# Patient Record
Sex: Male | Born: 1985 | Race: Black or African American | Hispanic: No | Marital: Single | Smoking: Current every day smoker
Health system: Southern US, Community
[De-identification: ages and names within clinical notes are randomized; demographics above are authoritative.]

## PROBLEM LIST (undated history)

## (undated) DIAGNOSIS — Z9109 Other allergy status, other than to drugs and biological substances: Secondary | ICD-10-CM

## (undated) DIAGNOSIS — F32A Depression, unspecified: Secondary | ICD-10-CM

## (undated) DIAGNOSIS — F909 Attention-deficit hyperactivity disorder, unspecified type: Secondary | ICD-10-CM

## (undated) DIAGNOSIS — T7840XA Allergy, unspecified, initial encounter: Secondary | ICD-10-CM

## (undated) DIAGNOSIS — F329 Major depressive disorder, single episode, unspecified: Secondary | ICD-10-CM

## (undated) HISTORY — DX: Depression, unspecified: F32.A

## (undated) HISTORY — DX: Major depressive disorder, single episode, unspecified: F32.9

## (undated) HISTORY — DX: Allergy, unspecified, initial encounter: T78.40XA

## (undated) HISTORY — DX: Attention-deficit hyperactivity disorder, unspecified type: F90.9

---

## 2011-09-24 ENCOUNTER — Emergency Department (HOSPITAL_COMMUNITY): Payer: PRIVATE HEALTH INSURANCE

## 2011-09-24 ENCOUNTER — Emergency Department (HOSPITAL_COMMUNITY)
Admission: EM | Admit: 2011-09-24 | Discharge: 2011-09-24 | Disposition: A | Discharge status: Home / Self Care | Payer: PRIVATE HEALTH INSURANCE | Source: Home / Self Care | Attending: Emergency Medicine | Admitting: Emergency Medicine

## 2011-09-24 ENCOUNTER — Encounter (HOSPITAL_COMMUNITY): Payer: Self-pay | Admitting: *Deleted

## 2011-09-24 ENCOUNTER — Emergency Department (INDEPENDENT_AMBULATORY_CARE_PROVIDER_SITE_OTHER): Payer: PRIVATE HEALTH INSURANCE

## 2011-09-24 DIAGNOSIS — H1130 Conjunctival hemorrhage, unspecified eye: Secondary | ICD-10-CM

## 2011-09-24 DIAGNOSIS — H05239 Hemorrhage of unspecified orbit: Secondary | ICD-10-CM

## 2011-09-24 HISTORY — DX: Other allergy status, other than to drugs and biological substances: Z91.09

## 2011-09-24 MED ORDER — POLYETHYL GLYCOL-PROPYL GLYCOL 0.4-0.3 % OP SOLN: OPHTHALMIC | Status: DC

## 2011-09-24 MED ORDER — IBUPROFEN 800 MG PO TABS: 800.0000 mg | ORAL_TABLET | Freq: Three times a day (TID) | ORAL | Status: AC
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
## 2013-09-27 ENCOUNTER — Ambulatory Visit (INDEPENDENT_AMBULATORY_CARE_PROVIDER_SITE_OTHER): Payer: 59 | Admitting: Emergency Medicine

## 2013-09-27 VITALS — BP 134/90 | HR 75 | Temp 98.0°F | Resp 16 | Ht 68.5 in | Wt 199.0 lb

## 2013-09-27 DIAGNOSIS — IMO0002 Reserved for concepts with insufficient information to code with codable children: Secondary | ICD-10-CM

## 2013-09-27 DIAGNOSIS — Z23 Encounter for immunization: Secondary | ICD-10-CM

## 2013-09-27 DIAGNOSIS — T148XXA Other injury of unspecified body region, initial encounter: Secondary | ICD-10-CM

## 2013-09-27 DIAGNOSIS — M79609 Pain in unspecified limb: Secondary | ICD-10-CM

## 2013-09-27 DIAGNOSIS — M79605 Pain in left leg: Secondary | ICD-10-CM
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
## 2013-09-27 NOTE — Patient Instructions (Signed)

## 2013-09-27 NOTE — Progress Notes (Signed)
Procedure:  Consent obtained.  Local anesthesia with 2% lido with epi.  4 cm wound cleaned and rough edges debrided.  Subcutaneous sutures # 3 placed and superficial closure with 4-0 Ethilon # 6 horizontal and #4 SI sutures placed.  Wound care d/w pt.  Drgs placed.

## 2013-10-11 ENCOUNTER — Ambulatory Visit (INDEPENDENT_AMBULATORY_CARE_PROVIDER_SITE_OTHER): Payer: 59 | Admitting: Family Medicine

## 2013-10-11 VITALS — BP 126/76 | HR 68 | Temp 98.1°F | Resp 16 | Ht 68.5 in | Wt 201.0 lb

## 2013-10-11 DIAGNOSIS — Z4802 Encounter for removal of sutures: Secondary | ICD-10-CM
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
## 2013-12-17 ENCOUNTER — Ambulatory Visit (INDEPENDENT_AMBULATORY_CARE_PROVIDER_SITE_OTHER): Payer: 59 | Admitting: Family Medicine

## 2013-12-17 VITALS — BP 122/80 | HR 98 | Temp 98.0°F | Resp 18 | Ht 68.5 in | Wt 208.0 lb

## 2013-12-17 DIAGNOSIS — K219 Gastro-esophageal reflux disease without esophagitis: Secondary | ICD-10-CM

## 2013-12-17 DIAGNOSIS — R0981 Nasal congestion: Secondary | ICD-10-CM

## 2013-12-17 DIAGNOSIS — J029 Acute pharyngitis, unspecified: Secondary | ICD-10-CM

## 2013-12-17 DIAGNOSIS — J020 Streptococcal pharyngitis: Secondary | ICD-10-CM

## 2013-12-17 DIAGNOSIS — J302 Other seasonal allergic rhinitis: Secondary | ICD-10-CM

## 2013-12-17 LAB — POCT RAPID STREP A (OFFICE): RAPID STREP A SCREEN: POSITIVE — AB

## 2013-12-17 MED ORDER — AMOXICILLIN 875 MG PO TABS: 875.0000 mg | ORAL_TABLET | Freq: Two times a day (BID) | ORAL | Status: AC

## 2013-12-17 MED ORDER — CETIRIZINE HCL 10 MG PO TABS: 10.0000 mg | ORAL_TABLET | Freq: Every day | ORAL | Status: DC

## 2013-12-17 MED ORDER — FLUTICASONE PROPIONATE 50 MCG/ACT NA SUSP: 2.0000 | Freq: Every day | NASAL | Status: DC

## 2013-12-17 MED ORDER — IPRATROPIUM BROMIDE 0.03 % NA SOLN: 2.0000 | Freq: Two times a day (BID) | NASAL | Status: DC

## 2013-12-17 NOTE — Patient Instructions (Signed)
Strep Throat Strep throat is an infection of the throat caused by a bacteria named Streptococcus pyogenes. Your health care provider may call the infection streptococcal "tonsillitis" or "pharyngitis" depending on whether there are signs of inflammation in the tonsils or back of the throat. Strep throat is most common in children aged 30-15 years during the cold months of the year, but it can occur in people of any age during any season. This infection is spread from person to person (contagious) through coughing, sneezing, or other close contact. SIGNS AND SYMPTOMS   Fever or chills.  Painful, swollen, red tonsils or throat.  Pain or difficulty when swallowing.  White or yellow spots on the tonsils or throat.  Swollen, tender lymph nodes or "glands" of the neck or under the jaw.  Red rash all over the body (rare). DIAGNOSIS  Many different infections can cause the same symptoms. A test must be done to confirm the diagnosis so the right treatment can be given. A "rapid strep test" can help your health care provider make the diagnosis in a few minutes. If this test is not available, a light swab of the infected area can be used for a throat culture test. If a throat culture test is done, results are usually available in a day or two. TREATMENT  Strep throat is treated with antibiotic medicine. HOME CARE INSTRUCTIONS   Gargle with 1 tsp of salt in 1 cup of warm water, 3-4 times per day or as needed for comfort.  Family members who also have a sore throat or fever should be tested for strep throat and treated with antibiotics if they have the strep infection.  Make sure everyone in your household washes their hands well.  Do not share food, drinking cups, or personal items that could cause the infection to spread to others.  You may need to eat a soft food diet until your sore throat gets better.  Drink enough water and fluids to keep your urine clear or pale yellow. This will help prevent  dehydration.  Get plenty of rest.  Stay home from school, day care, or work until you have been on antibiotics for 24 hours.  Take medicines only as directed by your health care provider.  Take your antibiotic medicine as directed by your health care provider. Finish it even if you start to feel better. SEEK MEDICAL CARE IF:   The glands in your neck continue to enlarge.  You develop a rash, cough, or earache.  You cough up green, yellow-brown, or bloody sputum.  You have pain or discomfort not controlled by medicines.  Your problems seem to be getting worse rather than better.  You have a fever. SEEK IMMEDIATE MEDICAL CARE IF:   You develop any new symptoms such as vomiting, severe headache, stiff or painful neck, chest pain, shortness of breath, or trouble swallowing.  You develop severe throat pain, drooling, or changes in your voice.  You develop swelling of the neck, or the skin on the neck becomes red and tender.  You develop signs of dehydration, such as fatigue, dry mouth, and decreased urination.  You become increasingly sleepy, or you cannot wake up completely. MAKE SURE YOU:  Understand these instructions.  Will watch your condition.  Will get help right away if you are not doing well or get worse. Document Released: 02/09/2000 Document Revised: 06/28/2013 Document Reviewed: 04/12/2010 [facility] Patient Information 2015 [place], [place]. This information is not intended to replace advice given to you by  your health care provider. Make sure you discuss any questions you have with your health care provider.    Allergic Rhinitis Allergic rhinitis is when the mucous membranes in the nose respond to allergens. Allergens are particles in the air that cause your body to have an allergic reaction. This causes you to release allergic antibodies. Through a chain of events, these eventually cause you to release histamine into the blood stream. Although meant to protect the  body, it is this release of histamine that causes your discomfort, such as frequent sneezing, congestion, and an itchy, runny nose.  CAUSES  Seasonal allergic rhinitis (hay fever) is caused by pollen allergens that may come from grasses, trees, and weeds. Year-round allergic rhinitis (perennial allergic rhinitis) is caused by allergens such as house dust mites, pet dander, and mold spores.  SYMPTOMS   Nasal stuffiness (congestion).  Itchy, runny nose with sneezing and tearing of the eyes. DIAGNOSIS  Your health care provider can help you determine the allergen or allergens that trigger your symptoms. If you and your health care provider are unable to determine the allergen, skin or blood testing may be used. TREATMENT  Allergic rhinitis does not have a cure, but it can be controlled by:  Medicines and allergy shots (immunotherapy).  Avoiding the allergen. Hay fever may often be treated with antihistamines in pill or nasal spray forms. Antihistamines block the effects of histamine. There are over-the-counter medicines that may help with nasal congestion and swelling around the eyes. Check with your health care provider before taking or giving this medicine.  If avoiding the allergen or the medicine prescribed do not work, there are many new medicines your health care provider can prescribe. Stronger medicine may be used if initial measures are ineffective. Desensitizing injections can be used if medicine and avoidance does not work. Desensitization is when a patient is given ongoing shots until the body becomes less sensitive to the allergen. Make sure you follow up with your health care provider if problems continue. HOME CARE INSTRUCTIONS It is not possible to completely avoid allergens, but you can reduce your symptoms by taking steps to limit your exposure to them. It helps to know exactly what you are allergic to so that you can avoid your specific triggers. SEEK MEDICAL CARE IF:   You have  a fever.  You develop a cough that does not stop easily (persistent).  You have shortness of breath.  You start wheezing.  Symptoms interfere with normal daily activities. Document Released: 11/06/2000 Document Revised: 02/16/2013 Document Reviewed: 10/19/2012 [facility] Patient Information 2015 [place]. This information is not intended to replace advice given to you by your health care provider. Make sure you discuss any questions you have with your health care provider.

## 2013-12-17 NOTE — Progress Notes (Signed)
Subjective:    Patient ID: Dan Brady, male    DOB: [birth year], 28 y.o.   MRN: [id number]  HPI  Dan Brady is presenting for 5 day history of abdominal pain and 1 day history of sore throat.  Abdominal pain - on Sunday, patient reports that started feeling ill, had fever, felt a stomach ache, nausea, vomiting (w/o blood) x5 through Wednesday, achy, fatigued, fevers continued through Tuesday. Patient drank fluids, chicken noodle soup, rested, tried thera-flu with moderate relief, still feels iffy with his stomach but no longer feels pain, n/v have resolved.   Sore throat - yesterday, started feeling throat soreness worse with swallowing, had a sinus headache, lymphadenopathy. He has only tried robitussin but denies cough, he is worried he has an infection. Denies sick contacts, fevers yesterday, congestion, ear pain, itchy watery eyes, tooth pain, sob, difficulty breathing. Smokes 1/2ppd and social drinking (1-2 beers) on the weekends.  GERD - patient reports ongoing reflux symptoms including epigastric pain and sour brash. Has been trying prilosec with some relief but would like recommendations. Eats acidic foods, pastas, hot sauce, symptoms worse when he lays down.   Denies any other questions, concerns, aggravating or relieving factors.   No current medications.  No Known Allergies  Past Medical History  Diagnosis Date  . Environmental allergies   . Allergy    History reviewed. No pertinent past surgical history.  History  Substance Use Topics  . Smoking status: Current Every Day Smoker  . Smokeless tobacco: Not on file  . Alcohol Use: 1.8 oz/week    2 Cans of beer, 1 Shots of liquor per week    Review of Systems As in subjective.    Objective:   Physical Exam  Vitals reviewed. Constitutional: He appears well-developed and well-nourished. No distress.  BP 122/80  Pulse 98  Temp(Src) 98 F (36.7 C) (Oral)  Resp 18  Ht 5' 8.5" (1.74 m)  Wt 208 lb (94.348 kg)   BMI 31.16 kg/m2  SpO2 98%   HENT:  Head: Normocephalic and atraumatic.  Right Ear: External ear normal.  Left Ear: External ear normal.  Mouth/Throat: Oropharynx is clear and moist. No oropharyngeal exudate.  Inflammed turbinates, thick mucous bilaterally.  Eyes: Conjunctivae and EOM are normal. Pupils are equal, round, and reactive to light. Right eye exhibits no discharge. Left eye exhibits no discharge. No scleral icterus.  Neck: Normal range of motion. Neck supple. No thyromegaly present.  Cardiovascular: Normal rate, regular rhythm, normal heart sounds and intact distal pulses.  Exam reveals no gallop and no friction rub.   No murmur heard. Pulmonary/Chest: Effort normal and breath sounds normal. No stridor. No respiratory distress. He has no wheezes. He has no rales.  Abdominal: Soft. Bowel sounds are normal. He exhibits no distension and no mass. There is no tenderness.  Lymphadenopathy:    He has cervical adenopathy (anterior).  Skin: Skin is warm and dry. No rash noted. He is not diaphoretic. No erythema.  Psychiatric: He has a normal mood and affect. His behavior is normal.   Results for orders placed in visit on 12/17/13 (from the past 24 hour(s))  POCT RAPID STREP A (OFFICE)     Status: Abnormal   Collection Time    12/17/13 11:13 AM      Result Value Ref Range   Rapid Strep A Screen Positive (*) Negative      Assessment & Plan:   1. Streptococcal sore throat 2. Sore throat Rx amoxicillin, OTC ibu  800mg  TID for sore throat, return to clinic if symptoms worsen, fail to resolve or as needed - amoxicillin (AMOXIL) 875 MG tablet; Take 1 tablet (875 mg total) by mouth 2 (two) times daily.  Dispense: 20 tablet; Refill: 0 - POCT rapid strep A  3. Seasonal allergies 4. Nasal congestion - cetirizine (ZYRTEC) 10 MG tablet; Take 1 tablet (10 mg total) by mouth daily.  Dispense: 30 tablet; Refill: 11 - fluticasone (FLONASE) 50 MCG/ACT nasal spray; Place 2 sprays into both  nostrils daily.  Dispense: 16 g; Refill: 6 - ipratropium (ATROVENT) 0.03 % nasal spray; Place 2 sprays into both nostrils 2 (two) times daily.  Dispense: 30 mL; Refill: 1  5. Gastroesophageal reflux disease, esophagitis presence not specified - Counseled patient on dietary modifications, continue prilosec, consider ranitidine if symptoms fail to resolve, otherwise f/u as above.   Wallis BambergMario Addie Alonge, PA-C [facility] [phone] 12/17/2013 3:46 PM

## 2013-12-18 NOTE — Progress Notes (Signed)
Patient discussed with Mr. Mani. Agree with assessment and plan of care per his note.   

## 2014-10-06 ENCOUNTER — Ambulatory Visit (INDEPENDENT_AMBULATORY_CARE_PROVIDER_SITE_OTHER): Payer: 59 | Admitting: Physician Assistant

## 2014-10-06 VITALS — BP 130/80 | HR 77 | Temp 98.0°F | Resp 16 | Ht 69.0 in | Wt 207.4 lb

## 2014-10-06 DIAGNOSIS — R1030 Lower abdominal pain, unspecified: Secondary | ICD-10-CM

## 2014-10-06 LAB — POCT URINALYSIS DIPSTICK
Bilirubin, UA: NEGATIVE
Glucose, UA: NEGATIVE
Ketones, UA: NEGATIVE
Leukocytes, UA: NEGATIVE
NITRITE UA: NEGATIVE
PH UA: 7
RBC UA: NEGATIVE
Spec Grav, UA: 1.025
UROBILINOGEN UA: 0.2

## 2014-10-06 LAB — POCT UA - MICROSCOPIC ONLY
Bacteria, U Microscopic: NEGATIVE
CRYSTALS, UR, HPF, POC: NEGATIVE
Casts, Ur, LPF, POC: NEGATIVE
EPITHELIAL CELLS, URINE PER MICROSCOPY: NEGATIVE
RBC, URINE, MICROSCOPIC: NEGATIVE
YEAST UA: NEGATIVE

## 2014-10-06 NOTE — Progress Notes (Signed)
10/06/2014 at 4:13 PM  Dan Brady / DOB: 04/13/1985 / MRN: [id number]  The patient  does not have a problem list on file.  SUBJECTIVE  Abdominal Pain The current episode started yesterday. The onset quality is sudden. The problem occurs intermittently. The problem has been unchanged. The pain is located in the suprapubic region. The pain is moderate. The quality of the pain is colicky. The abdominal pain does not radiate. Pertinent negatives include no anorexia, constipation, diarrhea, dysuria, frequency, hematochezia, hematuria, melena, nausea or vomiting. The pain is aggravated by certain positions.   Reports receiving oral sex from a new partner a few weeks ago, but denies vaginal intercourse.     He  has a past medical history of Environmental allergies and Allergy.    Medications reviewed and updated by myself where necessary, and exist elsewhere in the encounter.   Dan Brady has No Known Allergies. He  reports that he has been smoking.  He does not have any smokeless tobacco history on file. He reports that he drinks about 1.8 oz of alcohol per week. He reports that he does not use illicit drugs. He  has no sexual activity history on file. The patient  has no past surgical history on file.  His family history includes Diabetes in his mother; Hypertension in his mother.  Review of Systems  Gastrointestinal: Positive for abdominal pain. Negative for nausea, vomiting, diarrhea, constipation, melena, hematochezia and anorexia.  Genitourinary: Negative for dysuria, frequency and hematuria.    OBJECTIVE  His  height is  (1.753 m) and weight is 207 lb 6.4 oz (94.076 kg). His oral temperature is 98 F (36.7 C). His blood pressure is 130/80 and his pulse is 77. His respiration is 16 and oxygen saturation is 98%.  The patient's body mass index is 30.61 kg/(m^2).  Physical Exam  Vitals reviewed. Constitutional: He is oriented to person, place, and time. He appears  well-developed. No distress.  Eyes: EOM are normal. Pupils are equal, round, and reactive to light. No scleral icterus.  Neck: Normal range of motion.  Cardiovascular: Normal rate and regular rhythm.   Respiratory: Effort normal and breath sounds normal.  GI: Soft. Bowel sounds are normal. He exhibits no distension and no mass. There is no tenderness. There is no rebound and no guarding.  Musculoskeletal: Normal range of motion.  Neurological: He is alert and oriented to person, place, and time. No cranial nerve deficit.  Skin: Skin is warm and dry. No rash noted. He is not diaphoretic.  Psychiatric: He has a normal mood and affect.    Results for orders placed or performed in visit on 10/06/14 (from the past 24 hour(s))  POCT urinalysis dipstick     Status: None   Collection Time: 10/06/14  2:42 PM  Result Value Ref Range   Color, UA yellow    Clarity, UA clear    Glucose, UA neg    Bilirubin, UA neg    Ketones, UA neg    Spec Grav, UA 1.025    Blood, UA neg    pH, UA 7.0    Protein, UA trace    Urobilinogen, UA 0.2    Nitrite, UA neg    Leukocytes, UA Negative Negative  POCT UA - Microscopic Only     Status: None   Collection Time: 10/06/14  2:42 PM  Result Value Ref Range   WBC, Ur, HPF, POC 0-2    RBC, urine, microscopic neg  Bacteria, U Microscopic neg    Mucus, UA mod    Epithelial cells, urine per micros neg    Crystals, Ur, HPF, POC neg    Casts, Ur, LPF, POC neg    Yeast, UA neg     ASSESSMENT & PLAN  Glennis was seen today for abdominal pain.  Diagnoses and all orders for this visit:  Suprapubic pain, unspecified laterality: Patient with vague HPI and negative physical exam.  Will screen for STI and advise he RTC if his symptoms do not improve with time.   -     POCT urinalysis dipstick -     POCT UA - Microscopic Only -     GC/Chlamydia Probe Amp -     HIV antibody -     RPR    The patient was advised to call or come back to clinic if he does not  see an improvement in symptoms, or worsens with the above plan.   Deliah Boston, MHS, PA-C [facility] 10/06/2014 4:13 PM

## 2014-10-07 LAB — HIV ANTIBODY (ROUTINE TESTING W REFLEX): HIV: NONREACTIVE

## 2014-10-08 LAB — GC/CHLAMYDIA PROBE AMP
CT PROBE, AMP APTIMA: NEGATIVE
GC Probe RNA: NEGATIVE

## 2014-10-08 LAB — RPR

## 2014-10-08 NOTE — Progress Notes (Signed)
  Medical screening examination/treatment/procedure(s) were performed by non-physician practitioner and as supervising physician I was immediately available for consultation/collaboration.     

## 2015-03-14 ENCOUNTER — Ambulatory Visit (INDEPENDENT_AMBULATORY_CARE_PROVIDER_SITE_OTHER): Payer: 59 | Admitting: Family Medicine

## 2015-03-14 VITALS — BP 122/74 | HR 80 | Temp 98.7°F | Resp 17 | Ht 68.5 in | Wt 196.0 lb

## 2015-03-14 DIAGNOSIS — R112 Nausea with vomiting, unspecified: Secondary | ICD-10-CM | POA: Diagnosis not present

## 2015-03-14 DIAGNOSIS — J029 Acute pharyngitis, unspecified: Secondary | ICD-10-CM

## 2015-03-14 LAB — POCT RAPID STREP A (OFFICE): Rapid Strep A Screen: NEGATIVE

## 2015-03-14 NOTE — Patient Instructions (Signed)
I think that you are on the mend at this point- however please let me know if you do not continue to improve Your strep test was negative Eat a bland diet and drink plenty of fluids until you are well

## 2015-03-14 NOTE — Progress Notes (Signed)
[facility] [address], [place] [address] [id number] [phone]  Date:  03/14/2015   Name:  Dan Brady   DOB:  1985-06-16   MRN:  [id number]  PCP:  No PCP Per Patient    Chief Complaint: Shortness of Breath; Cough; and Headache   History of Present Illness:  Dan Brady is a 30 y.o. very pleasant male patient who presents with the following:  Generally healthy young man here today with complaint of illness Today is Tuesday. On Sunday he awoke with a HA, nausea and vomiting.  Vomited just that one day. Currently the HA and vomiting are better, but he has some ST, mild cough.  He did have a fever on Sunday and Monday- now resolved He was not having diarrhea. No abd pain  He also recently had a URI for about 10 days with runny nose, slight cough.  This seems to be better but he is not sure if it might be related to his current sx  He is generally in good health  He has been exposed to sick people at his work He did try some cough med and thera-flu His breathing feels ok now He did eat last night- did not eat yet today but states he generally only eats one meal per day  Overall he feels like he is improving but wanted to be sure all was ok   There are no active problems to display for this patient.   Past Medical History  Diagnosis Date  . Environmental allergies   . Allergy     No past surgical history on file.  Social History  Substance Use Topics  . Smoking status: Current Every Day Smoker -- 0.75 packs/day for 10 years    Types: Cigarettes  . Smokeless tobacco: None  . Alcohol Use: 1.8 oz/week    2 Cans of beer, 1 Shots of liquor per week    Family History  Problem Relation Age of Onset  . Diabetes Mother   . Hypertension Mother     No Known Allergies  Medication list has been reviewed and updated.  No current outpatient prescriptions on file prior to visit.   No current facility-administered medications on file prior to  visit.    Review of Systems:  As per HPI- otherwise negative.   Physical Examination: Filed Vitals:   03/14/15 1414  BP: 122/74  Pulse: 99  Temp: 98.7 F (37.1 C)  Resp: 17   Filed Vitals:   03/14/15 1414  Height: 5' 8.5" (1.74 m)  Weight: 196 lb (88.905 kg)   Body mass index is 29.36 kg/(m^2). Ideal Body Weight: Weight in (lb) to have BMI = 25: 166.5  GEN: WDWN, NAD, Non-toxic, A & O x 3, mild overweight, looks well HEENT: Atraumatic, Normocephalic. Neck supple. No masses, No LAD. Bilateral TM wnl, oropharynx injected but no exudate.  PEERL,EOMI.   Ears and Nose: No external deformity. CV: RRR, No M/G/R. No JVD. No thrill. No extra heart sounds. PULM: CTA B, no wheezes, crackles, rhonchi. No retractions. No resp. distress. No accessory muscle use. ABD: S, NT, ND, benign belly EXTR: No c/c/e NEURO Normal gait.  PSYCH: Normally interactive. Conversant. Not depressed or anxious appearing.  Calm demeanor.   Results for orders placed or performed in visit on 03/14/15  POCT rapid strep A  Result Value Ref Range   Rapid Strep A Screen Negative Negative    Assessment and Plan: Pharyngitis - Plan: POCT rapid  strep A  Non-intractable vomiting with nausea, unspecified vomiting type  Reassured that he seems to be getting better He will let me know if not continuing to improve Encouraged him to eat more regularly and to drink plenty of fluids  Note given for work   Signed Abbe Amsterdam, MD

## 2015-03-30 ENCOUNTER — Ambulatory Visit (INDEPENDENT_AMBULATORY_CARE_PROVIDER_SITE_OTHER): Payer: 59 | Admitting: Family Medicine

## 2015-03-30 VITALS — BP 140/80 | HR 92 | Temp 98.1°F | Resp 18 | Ht 69.69 in | Wt 201.6 lb

## 2015-03-30 DIAGNOSIS — R0981 Nasal congestion: Secondary | ICD-10-CM

## 2015-03-30 DIAGNOSIS — J309 Allergic rhinitis, unspecified: Secondary | ICD-10-CM | POA: Diagnosis not present

## 2015-03-30 DIAGNOSIS — R197 Diarrhea, unspecified: Secondary | ICD-10-CM

## 2015-03-30 NOTE — Patient Instructions (Signed)
For the nasal congestion, can try a different steroid nasal spray such as Nasonex nasal spray. Also can take Zyrtec, Allegra, or Claritin over-the-counter once per day. See other information below on allergies. If the congestion is not improving in the next month or worsening sooner, let me know and I can refer you to either ear nose and throat or an allergist.  As the diarrhea has improved, I suspect this is a virus that should continue to improve. Okay to return to work tomorrow if you continue to feel better. I do recommend bland foods today, but water and other fluids are most important.  Restart other foods slowly as your abdominal symptoms improve.    Diarrhea Diarrhea is frequent loose and watery bowel movements. It can cause you to feel weak and dehydrated. Dehydration can cause you to become tired and thirsty, have a dry mouth, and have decreased urination that often is dark yellow. Diarrhea is a sign of another problem, most often an infection that will not last long. In most cases, diarrhea typically lasts 2-3 days. However, it can last longer if it is a sign of something more serious. It is important to treat your diarrhea as directed by your caregiver to lessen or prevent future episodes of diarrhea. CAUSES  Some common causes include:  Gastrointestinal infections caused by viruses, bacteria, or parasites.  Food poisoning or food allergies.  Certain medicines, such as antibiotics, chemotherapy, and laxatives.  Artificial sweeteners and fructose.  Digestive disorders. HOME CARE INSTRUCTIONS  Ensure adequate fluid intake (hydration): Have 1 cup (8 oz) of fluid for each diarrhea episode. Avoid fluids that contain simple sugars or sports drinks, fruit juices, whole milk products, and sodas. Your urine should be clear or pale yellow if you are drinking enough fluids. Hydrate with an oral rehydration solution that you can purchase at pharmacies, retail stores, and online. You can prepare  an oral rehydration solution at home by mixing the following ingredients together:   - tsp table salt.   tsp baking soda.   tsp salt substitute containing potassium chloride.  1  tablespoons sugar.  1 L (34 oz) of water.  Certain foods and beverages may increase the speed at which food moves through the gastrointestinal (GI) tract. These foods and beverages should be avoided and include:  Caffeinated and alcoholic beverages.  High-fiber foods, such as raw fruits and vegetables, nuts, seeds, and whole grain breads and cereals.  Foods and beverages sweetened with sugar alcohols, such as xylitol, sorbitol, and mannitol.  Some foods may be well tolerated and may help thicken stool including:  Starchy foods, such as rice, toast, pasta, low-sugar cereal, oatmeal, grits, baked potatoes, crackers, and bagels.  Bananas.  Applesauce.  Add probiotic-rich foods to help increase healthy bacteria in the GI tract, such as yogurt and fermented milk products.  Wash your hands well after each diarrhea episode.  Only take over-the-counter or prescription medicines as directed by your caregiver.  Take a warm bath to relieve any burning or pain from frequent diarrhea episodes. SEEK IMMEDIATE MEDICAL CARE IF:   You are unable to keep fluids down.  You have persistent vomiting.  You have blood in your stool, or your stools are black and tarry.  You do not urinate in 6-8 hours, or there is only a small amount of very dark urine.  You have abdominal pain that increases or localizes.  You have weakness, dizziness, confusion, or light-headedness.  You have a severe headache.  Your diarrhea gets  worse or does not get better.  You have a fever or persistent symptoms for more than 2-3 days.  You have a fever and your symptoms suddenly get worse. MAKE SURE YOU:   Understand these instructions.  Will watch your condition.  Will get help right away if you are not doing well or get  worse.   This information is not intended to replace advice given to you by your health care provider. Make sure you discuss any questions you have with your health care provider.   Document Released: 02/01/2002 Document Revised: 03/04/2014 Document Reviewed: 10/20/2011 Elsevier Interactive Patient Education 2016 [employer].   Allergic Rhinitis Allergic rhinitis is when the mucous membranes in the nose respond to allergens. Allergens are particles in the air that cause your body to have an allergic reaction. This causes you to release allergic antibodies. Through a chain of events, these eventually cause you to release histamine into the blood stream. Although meant to protect the body, it is this release of histamine that causes your discomfort, such as frequent sneezing, congestion, and an itchy, runny nose.  CAUSES Seasonal allergic rhinitis (hay fever) is caused by pollen allergens that may come from grasses, trees, and weeds. Year-round allergic rhinitis (perennial allergic rhinitis) is caused by allergens such as house dust mites, pet dander, and mold spores. SYMPTOMS  Nasal stuffiness (congestion).  Itchy, runny nose with sneezing and tearing of the eyes. DIAGNOSIS Your health care provider can help you determine the allergen or allergens that trigger your symptoms. If you and your health care provider are unable to determine the allergen, skin or blood testing may be used. Your health care provider will diagnose your condition after taking your health history and performing a physical exam. Your health care provider may assess you for other related conditions, such as asthma, pink eye, or an ear infection. TREATMENT Allergic rhinitis does not have a cure, but it can be controlled by:  Medicines that block allergy symptoms. These may include allergy shots, nasal sprays, and oral antihistamines.  Avoiding the allergen. Hay fever may often be treated with antihistamines in pill or  nasal spray forms. Antihistamines block the effects of histamine. There are over-the-counter medicines that may help with nasal congestion and swelling around the eyes. Check with your health care provider before taking or giving this medicine. If avoiding the allergen or the medicine prescribed do not work, there are many new medicines your health care provider can prescribe. Stronger medicine may be used if initial measures are ineffective. Desensitizing injections can be used if medicine and avoidance does not work. Desensitization is when a patient is given ongoing shots until the body becomes less sensitive to the allergen. Make sure you follow up with your health care provider if problems continue. HOME CARE INSTRUCTIONS It is not possible to completely avoid allergens, but you can reduce your symptoms by taking steps to limit your exposure to them. It helps to know exactly what you are allergic to so that you can avoid your specific triggers. SEEK MEDICAL CARE IF:  You have a fever.  You develop a cough that does not stop easily (persistent).  You have shortness of breath.  You start wheezing.  Symptoms interfere with normal daily activities.   This information is not intended to replace advice given to you by your health care provider. Make sure you discuss any questions you have with your health care provider.   Document Released: 11/06/2000 Document Revised: 03/04/2014 Document Reviewed: 10/19/2012  Chartered certified accountant Patient Education [employer].

## 2015-03-30 NOTE — Progress Notes (Signed)
Subjective:  This chart was scribed for Dan Staggers MD, by [name], at [facility].  This patient was seen in room 14 and the patient's care was started at 1:15 PM.    Patient ID: Dan Brady, male    DOB: Mar 02, 1985, 30 y.o.   MRN: [id number] Chief Complaint  Patient presents with  . Follow-up    nasal congestion     HPI  HPI Comments: Dan Brady is a 30 y.o. male who presents to the [facility] for [facility] follow up.  Patient woke up two days ago with diarrhea as well as nausea.  He feels that his symptoms have calmed down today and he now has normal bowel movements. He states that he has been eating crackers and drinking water frequently.  Denies any vomiting.  He has missed the past three days at work ([employer]).  Patient states that his children had diarrhea the past week as well.    Nasal congestion: Patient states that his congestion has been going on for the past year. His left nostril tends to drain more than his right side.  Patient feels like when he blows his nose, his ears are popping. He has associated symptoms of watery red eyes and sneezing.   He was given Flonase (2X per day- both nostrils- used for 3-4 months) and states that it felt like his rhinorrhea increased with it.  He also used Afrin for 3 days but then discontinued using it.  He states that it worked really well so he stopped.  He has not used any other nasal sprays.  He has not used any over the counter anti histamines but is willing to try them. Patient has hard wood and carpet in his home.  He does not have any pets. He states that the air at his work place (for the past 11 years) is thick and there are no windows present. ------- Patient was seen on the 17 th by Dr. Dallas Schimke and was diagnosed with pharyngitis, not strep. No nausea or vomiting.   There are no active problems to display for this patient.  Past Medical History  Diagnosis Date  .  Environmental allergies   . Allergy    History reviewed. No pertinent past surgical history. No Known Allergies Prior to Admission medications   Not on File   Social History   Social History  . Marital Status: Single    Spouse Name: N/A  . Number of Children: N/A  . Years of Education: N/A   Occupational History  . Not on file.   Social History Main Topics  . Smoking status: Current Every Day Smoker -- 0.75 packs/day for 10 years    Types: Cigarettes  . Smokeless tobacco: Not on file  . Alcohol Use: 1.8 oz/week    2 Cans of beer, 1 Shots of liquor per week  . Drug Use: No  . Sexual Activity: Not on file   Other Topics Concern  . Not on file   Social History Narrative      Review of Systems  Constitutional: Negative for fever and chills.  HENT: Positive for congestion and sneezing.   Eyes: Positive for redness. Negative for pain.  Respiratory: Negative for cough, choking and shortness of breath.   Gastrointestinal: Positive for nausea and diarrhea. Negative for vomiting.  Musculoskeletal: Negative for neck pain and neck stiffness.  Neurological: Negative for seizures, syncope and speech difficulty.  Objective:   Physical Exam  Constitutional: He is oriented to person, place, and time. He appears well-developed and well-nourished. No distress.  HENT:  Head: Normocephalic and atraumatic.  Right Ear: Tympanic membrane, external ear and ear canal normal.  Left Ear: Tympanic membrane, external ear and ear canal normal.  Nose: No rhinorrhea.  Mouth/Throat: Oropharynx is clear and moist and mucous membranes are normal. No oropharyngeal exudate or posterior oropharyngeal erythema.  edematous turbinates left greater than right. No active discharge.    Eyes: Conjunctivae are normal. Pupils are equal, round, and reactive to light.  Slight scleral  redness/injection in his eyes bilaterally.   Neck: Neck supple.  Cardiovascular: Normal rate, regular rhythm, normal  heart sounds and intact distal pulses.   No murmur heard. Pulmonary/Chest: Effort normal and breath sounds normal. No respiratory distress. He has no wheezes. He has no rhonchi. He has no rales.  Abdominal: Soft. Bowel sounds are normal. He exhibits no distension. There is no tenderness.  Lymphadenopathy:    He has no cervical adenopathy.  Neurological: He is alert and oriented to person, place, and time.  Skin: Skin is warm and dry. No rash noted.  Psychiatric: He has a normal mood and affect. His behavior is normal.  Vitals reviewed.  Filed Vitals:   03/30/15 1245  BP: 140/80  Pulse: 92  Temp: 98.1 F (36.7 C)  TempSrc: Oral  Resp: 18  Height: 5' 9.69" (1.77 m)  Weight: 201 lb 9.6 oz (91.445 kg)  SpO2: 97%     Assessment & Plan:  Dan Brady is a 30 y.o. male Nasal congestion  Allergic rhinitis, unspecified allergic rhinitis type  Diarrhea, unspecified type  Suspected chronic allergic rhinitis  -Trial of over-the-counter zyrtec, Allegra, or Claritin once per day. Can try different steroid nasal spray such as Nasonex to see if he has improved symptom relief.   Allergen avoidance measures discussed.  -If not improving, consider ENT or allergy referral.  Diarrhea - likely viral syndrome with sick contacts. Improving.  -Continue symptomatic care, bland foods, RTC precautions.    No orders of the defined types were placed in this encounter.   Patient Instructions  For the nasal congestion, can try a different steroid nasal spray such as Nasonex nasal spray. Also can take Zyrtec, Allegra, or Claritin over-the-counter once per day. See other information below on allergies. If the congestion is not improving in the next month or worsening sooner, let me know and I can refer you to either ear nose and throat or an allergist.  As the diarrhea has improved, I suspect this is a virus that should continue to improve. Okay to return to work tomorrow if you continue to feel  better. I do recommend bland foods today, but water and other fluids are most important.  Restart other foods slowly as your abdominal symptoms improve.    Diarrhea Diarrhea is frequent loose and watery bowel movements. It can cause you to feel weak and dehydrated. Dehydration can cause you to become tired and thirsty, have a dry mouth, and have decreased urination that often is dark yellow. Diarrhea is a sign of another problem, most often an infection that will not last long. In most cases, diarrhea typically lasts 2-3 days. However, it can last longer if it is a sign of something more serious. It is important to treat your diarrhea as directed by your caregiver to lessen or prevent future episodes of diarrhea. CAUSES  Some common causes include:  Gastrointestinal infections  caused by viruses, bacteria, or parasites.  Food poisoning or food allergies.  Certain medicines, such as antibiotics, chemotherapy, and laxatives.  Artificial sweeteners and fructose.  Digestive disorders. HOME CARE INSTRUCTIONS  Ensure adequate fluid intake (hydration): Have 1 cup (8 oz) of fluid for each diarrhea episode. Avoid fluids that contain simple sugars or sports drinks, fruit juices, whole milk products, and sodas. Your urine should be clear or pale yellow if you are drinking enough fluids. Hydrate with an oral rehydration solution that you can purchase at pharmacies, retail stores, and online. You can prepare an oral rehydration solution at home by mixing the following ingredients together:   - tsp table salt.   tsp baking soda.   tsp salt substitute containing potassium chloride.  1  tablespoons sugar.  1 L (34 oz) of water.  Certain foods and beverages may increase the speed at which food moves through the gastrointestinal (GI) tract. These foods and beverages should be avoided and include:  Caffeinated and alcoholic beverages.  High-fiber foods, such as raw fruits and vegetables, nuts, seeds,  and whole grain breads and cereals.  Foods and beverages sweetened with sugar alcohols, such as xylitol, sorbitol, and mannitol.  Some foods may be well tolerated and may help thicken stool including:  Starchy foods, such as rice, toast, pasta, low-sugar cereal, oatmeal, grits, baked potatoes, crackers, and bagels.  Bananas.  Applesauce.  Add probiotic-rich foods to help increase healthy bacteria in the GI tract, such as yogurt and fermented milk products.  Wash your hands well after each diarrhea episode.  Only take over-the-counter or prescription medicines as directed by your caregiver.  Take a warm bath to relieve any burning or pain from frequent diarrhea episodes. SEEK IMMEDIATE MEDICAL CARE IF:   You are unable to keep fluids down.  You have persistent vomiting.  You have blood in your stool, or your stools are black and tarry.  You do not urinate in 6-8 hours, or there is only a small amount of very dark urine.  You have abdominal pain that increases or localizes.  You have weakness, dizziness, confusion, or light-headedness.  You have a severe headache.  Your diarrhea gets worse or does not get better.  You have a fever or persistent symptoms for more than 2-3 days.  You have a fever and your symptoms suddenly get worse. MAKE SURE YOU:   Understand these instructions.  Will watch your condition.  Will get help right away if you are not doing well or get worse.   This information is not intended to replace advice given to you by your health care provider. Make sure you discuss any questions you have with your health care provider.   Document Released: 02/01/2002 Document Revised: 03/04/2014 Document Reviewed: 10/20/2011 Elsevier Interactive Patient Education 2016 [employer].   Allergic Rhinitis Allergic rhinitis is when the mucous membranes in the nose respond to allergens. Allergens are particles in the air that cause your body to have an allergic  reaction. This causes you to release allergic antibodies. Through a chain of events, these eventually cause you to release histamine into the blood stream. Although meant to protect the body, it is this release of histamine that causes your discomfort, such as frequent sneezing, congestion, and an itchy, runny nose.  CAUSES Seasonal allergic rhinitis (hay fever) is caused by pollen allergens that may come from grasses, trees, and weeds. Year-round allergic rhinitis (perennial allergic rhinitis) is caused by allergens such as house dust mites, pet dander,  and mold spores. SYMPTOMS  Nasal stuffiness (congestion).  Itchy, runny nose with sneezing and tearing of the eyes. DIAGNOSIS Your health care provider can help you determine the allergen or allergens that trigger your symptoms. If you and your health care provider are unable to determine the allergen, skin or blood testing may be used. Your health care provider will diagnose your condition after taking your health history and performing a physical exam. Your health care provider may assess you for other related conditions, such as asthma, pink eye, or an ear infection. TREATMENT Allergic rhinitis does not have a cure, but it can be controlled by:  Medicines that block allergy symptoms. These may include allergy shots, nasal sprays, and oral antihistamines.  Avoiding the allergen. Hay fever may often be treated with antihistamines in pill or nasal spray forms. Antihistamines block the effects of histamine. There are over-the-counter medicines that may help with nasal congestion and swelling around the eyes. Check with your health care provider before taking or giving this medicine. If avoiding the allergen or the medicine prescribed do not work, there are many new medicines your health care provider can prescribe. Stronger medicine may be used if initial measures are ineffective. Desensitizing injections can be used if medicine and avoidance does  not work. Desensitization is when a patient is given ongoing shots until the body becomes less sensitive to the allergen. Make sure you follow up with your health care provider if problems continue. HOME CARE INSTRUCTIONS It is not possible to completely avoid allergens, but you can reduce your symptoms by taking steps to limit your exposure to them. It helps to know exactly what you are allergic to so that you can avoid your specific triggers. SEEK MEDICAL CARE IF:  You have a fever.  You develop a cough that does not stop easily (persistent).  You have shortness of breath.  You start wheezing.  Symptoms interfere with normal daily activities.   This information is not intended to replace advice given to you by your health care provider. Make sure you discuss any questions you have with your health care provider.   Document Released: 11/06/2000 Document Revised: 03/04/2014 Document Reviewed: 10/19/2012 Elsevier Interactive Patient Education [employer]     I personally performed the services described in this documentation, which was scribed in my presence. The recorded information has been reviewed and considered, and addended by me as needed.

## 2015-04-02 ENCOUNTER — Emergency Department (INDEPENDENT_AMBULATORY_CARE_PROVIDER_SITE_OTHER)
Admission: EM | Admit: 2015-04-02 | Discharge: 2015-04-02 | Disposition: A | Discharge status: Home / Self Care | Payer: PRIVATE HEALTH INSURANCE | Source: Home / Self Care | Attending: Family Medicine | Admitting: Family Medicine

## 2015-04-02 ENCOUNTER — Encounter (HOSPITAL_COMMUNITY): Payer: Self-pay | Admitting: *Deleted

## 2015-04-02 DIAGNOSIS — S91332A Puncture wound without foreign body, left foot, initial encounter: Secondary | ICD-10-CM | POA: Diagnosis not present

## 2015-04-02 NOTE — ED Notes (Addendum)
Reports stepping on a nail with bare left foot yesterday - pulled nail out.  Has cleansed area with H2O2.  Last Tdap = 2016.  Small superficial skin avulsion noted to arch of left foot.

## 2015-04-02 NOTE — ED Provider Notes (Signed)
CSN: [id number]     Arrival date & time 04/02/15  1326 History   First MD Initiated Contact with Patient 04/02/15 1422     Chief Complaint  Patient presents with  . Puncture Wound   (Consider location/radiation/quality/duration/timing/severity/associated sxs/prior Treatment) Patient is a 30 y.o. male presenting with wound check. The history is provided by the patient.  Wound Check This is a new problem. The current episode started yesterday (in house tearing apart a bookshelf and stepped on a nail to left foot while barefoot). The problem has not changed since onset.Associated symptoms comments: utd on tetanus last year.. The symptoms are aggravated by walking.    Past Medical History  Diagnosis Date  . Environmental allergies   . Allergy    History reviewed. No pertinent past surgical history. Family History  Problem Relation Age of Onset  . Diabetes Mother   . Hypertension Mother    Social History  Substance Use Topics  . Smoking status: Current Every Day Smoker -- 0.75 packs/day for 10 years    Types: Cigarettes  . Smokeless tobacco: None  . Alcohol Use: Yes     Comment: occasional    Review of Systems  Constitutional: Negative.   Musculoskeletal: Positive for gait problem.  Skin: Positive for wound.  All other systems reviewed and are negative.   Allergies  Review of patient's allergies indicates no known allergies.  Home Medications   Prior to Admission medications   Not on File   Meds Ordered and Administered this Visit  Medications - No data to display  BP 127/83 mmHg  Pulse 72  Temp(Src) 97.6 F (36.4 C) (Oral)  Resp 16  SpO2 99% No data found.   Physical Exam  Constitutional: He is oriented to person, place, and time.  Musculoskeletal: He exhibits tenderness.       Feet:  Neurological: He is alert and oriented to person, place, and time.  Skin: Skin is warm and dry. No erythema.  Nursing note and vitals reviewed.   ED Course  Procedures  (including critical care time)  Labs Review Labs Reviewed - No data to display  Imaging Review No results found.   Visual Acuity Review  Right Eye Distance:   Left Eye Distance:   Bilateral Distance:    Right Eye Near:   Left Eye Near:    Bilateral Near:         MDM   1. Puncture wound of foot excluding toes without complication, left, initial encounter        Linna Hoff, MD 04/02/15 1445

## 2015-06-09 ENCOUNTER — Ambulatory Visit (INDEPENDENT_AMBULATORY_CARE_PROVIDER_SITE_OTHER): Payer: 59

## 2015-06-09 ENCOUNTER — Ambulatory Visit (INDEPENDENT_AMBULATORY_CARE_PROVIDER_SITE_OTHER): Payer: 59 | Admitting: Family Medicine

## 2015-06-09 VITALS — BP 128/78 | HR 88 | Temp 98.3°F | Resp 18 | Ht 69.0 in | Wt 189.2 lb

## 2015-06-09 DIAGNOSIS — M79644 Pain in right finger(s): Secondary | ICD-10-CM

## 2015-06-09 DIAGNOSIS — S62501A Fracture of unspecified phalanx of right thumb, initial encounter for closed fracture: Secondary | ICD-10-CM

## 2015-06-09 DIAGNOSIS — R42 Dizziness and giddiness: Secondary | ICD-10-CM | POA: Diagnosis not present

## 2015-06-09 DIAGNOSIS — S0011XA Contusion of right eyelid and periocular area, initial encounter: Secondary | ICD-10-CM | POA: Diagnosis not present

## 2015-06-09 DIAGNOSIS — S060X9A Concussion with loss of consciousness of unspecified duration, initial encounter: Secondary | ICD-10-CM | POA: Insufficient documentation

## 2015-06-09 DIAGNOSIS — S060X1A Concussion with loss of consciousness of 30 minutes or less, initial encounter: Secondary | ICD-10-CM
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
## 2015-06-09 NOTE — Patient Instructions (Addendum)
Thank you for coming in today. Please follow-up with myself at [facility] [address] Phone number is [phone]   Concussion, Adult A concussion, or closed-head injury, is a brain injury caused by a direct blow to the head or by a quick and sudden movement (jolt) of the head or neck. Concussions are usually not life-threatening. Even so, the effects of a concussion can be serious. If you have had a concussion before, you are more likely to experience concussion-like symptoms after a direct blow to the head.  CAUSES  Direct blow to the head, such as from running into another player during a soccer game, being hit in a fight, or hitting your head on a hard surface.  A jolt of the head or neck that causes the brain to move back and forth inside the skull, such as in a car crash. SIGNS AND SYMPTOMS The signs of a concussion can be hard to notice. Early on, they may be missed by you, family members, and health care providers. You may look fine but act or feel differently. Symptoms are usually temporary, but they may last for days, weeks, or even longer. Some symptoms may appear right away while others may not show up for hours or days. Every head injury is different. Symptoms include:  Mild to moderate headaches that will not go away.  A feeling of pressure inside your head.  Having more trouble than usual:  Learning or remembering things you have heard.  Answering questions.  Paying attention or concentrating.  Organizing daily tasks.  Making decisions and solving problems.  Slowness in thinking, acting or reacting, speaking, or reading.  Getting lost or being easily confused.  Feeling tired all the time or lacking energy (fatigued).  Feeling drowsy.  Sleep disturbances.  Sleeping more than usual.  Sleeping less than usual.  Trouble falling asleep.  Trouble sleeping (insomnia).  Loss of  balance or feeling lightheaded or dizzy.  Nausea or vomiting.  Numbness or tingling.  Increased sensitivity to:  Sounds.  Lights.  Distractions.  Vision problems or eyes that tire easily.  Diminished sense of taste or smell.  Ringing in the ears.  Mood changes such as feeling sad or anxious.  Becoming easily irritated or angry for little or no reason.  Lack of motivation.  Seeing or hearing things other people do not see or hear (hallucinations). DIAGNOSIS Your health care provider can usually diagnose a concussion based on a description of your injury and symptoms. He or she will ask whether you passed out (lost consciousness) and whether you are having trouble remembering events that happened right before and during your injury. Your evaluation might include:  A brain scan to look for signs of injury to the brain. Even if the test shows no injury, you may still have a concussion.  Blood tests to be sure other problems are not present. TREATMENT  Concussions are usually treated in an emergency department, in urgent care, or at a clinic. You may need to stay in the hospital overnight for further treatment.  Tell your health care provider if you are taking any medicines, including prescription medicines, over-the-counter medicines, and natural remedies. Some medicines, such as blood thinners (anticoagulants) and aspirin, may increase the chance of complications. Also tell your health care provider whether you have had alcohol or are taking illegal drugs. This information may affect treatment.  Your health care provider will send you home  with important instructions to follow.  How fast you will recover from a concussion depends on many factors. These factors include how severe your concussion is, what part of your brain was injured, your age, and how healthy you were before the concussion.  Most people with mild injuries recover fully. Recovery can take time. In general,  recovery is slower in older persons. Also, persons who have had a concussion in the past or have other medical problems may find that it takes longer to recover from their current injury. HOME CARE INSTRUCTIONS General Instructions  Carefully follow the directions your health care provider gave you.  Only take over-the-counter or prescription medicines for pain, discomfort, or fever as directed by your health care provider.  Take only those medicines that your health care provider has approved.  Do not drink alcohol until your health care provider says you are well enough to do so. Alcohol and certain other drugs may slow your recovery and can put you at risk of further injury.  If it is harder than usual to remember things, write them down.  If you are easily distracted, try to do one thing at a time. For example, do not try to watch TV while fixing dinner.  Talk with family members or close friends when making important decisions.  Keep all follow-up appointments. Repeated evaluation of your symptoms is recommended for your recovery.  Watch your symptoms and tell others to do the same. Complications sometimes occur after a concussion. Older adults with a brain injury may have a higher risk of serious complications, such as a blood clot on the brain.  Tell your teachers, school nurse, school counselor, coach, athletic trainer, or work Production designer, theatre/television/film about your injury, symptoms, and restrictions. Tell them about what you can or cannot do. They should watch for:  Increased problems with attention or concentration.  Increased difficulty remembering or learning new information.  Increased time needed to complete tasks or assignments.  Increased irritability or decreased ability to cope with stress.  Increased symptoms.  Rest. Rest helps the brain to heal. Make sure you:  Get plenty of sleep at night. Avoid staying up late at night.  Keep the same bedtime hours on weekends and  weekdays.  Rest during the day. Take daytime naps or rest breaks when you feel tired.  Limit activities that require a lot of thought or concentration. These include:  Doing homework or job-related work.  Watching TV.  Working on the computer.  Avoid any situation where there is potential for another head injury (football, hockey, soccer, basketball, martial arts, downhill snow sports and horseback riding). Your condition will get worse every time you experience a concussion. You should avoid these activities until you are evaluated by the appropriate follow-up health care providers. Returning To Your Regular Activities You will need to return to your normal activities slowly, not all at once. You must give your body and brain enough time for recovery.  Do not return to sports or other athletic activities until your health care provider tells you it is safe to do so.  Ask your health care provider when you can drive, ride a bicycle, or operate heavy machinery. Your ability to react may be slower after a brain injury. Never do these activities if you are dizzy.  Ask your health care provider about when you can return to work or school. Preventing Another Concussion It is very important to avoid another brain injury, especially before you have recovered. In rare cases,  another injury can lead to permanent brain damage, brain swelling, or death. The risk of this is greatest during the first 7-10 days after a head injury. Avoid injuries by:  Wearing a seat belt when riding in a car.  Drinking alcohol only in moderation.  Wearing a helmet when biking, skiing, skateboarding, skating, or doing similar activities.  Avoiding activities that could lead to a second concussion, such as contact or recreational sports, until your health care provider says it is okay.  Taking safety measures in your home.  Remove clutter and tripping hazards from floors and stairways.  Use grab bars in bathrooms  and handrails by stairs.  Place non-slip mats on floors and in bathtubs.  Improve lighting in dim areas. SEEK MEDICAL CARE IF:  You have increased problems paying attention or concentrating.  You have increased difficulty remembering or learning new information.  You need more time to complete tasks or assignments than before.  You have increased irritability or decreased ability to cope with stress.  You have more symptoms than before. Seek medical care if you have any of the following symptoms for more than 2 weeks after your injury:  Lasting (chronic) headaches.  Dizziness or balance problems.  Nausea.  Vision problems.  Increased sensitivity to noise or light.  Depression or mood swings.  Anxiety or irritability.  Memory problems.  Difficulty concentrating or paying attention.  Sleep problems.  Feeling tired all the time. SEEK IMMEDIATE MEDICAL CARE IF:  You have severe or worsening headaches. These may be a sign of a blood clot in the brain.  You have weakness (even if only in one hand, leg, or part of the face).  You have numbness.  You have decreased coordination.  You vomit repeatedly.  You have increased sleepiness.  One pupil is larger than the other.  You have convulsions.  You have slurred speech.  You have increased confusion. This may be a sign of a blood clot in the brain.  You have increased restlessness, agitation, or irritability.  You are unable to recognize people or places.  You have neck pain.  It is difficult to wake you up.  You have unusual behavior changes.  You lose consciousness. MAKE SURE YOU:  Understand these instructions.  Will watch your condition.  Will get help right away if you are not doing well or get worse.   This information is not intended to replace advice given to you by your health care provider. Make sure you discuss any questions you have with your health care provider.   Document Released:  05/04/2003 Document Revised: 03/04/2014 Document Reviewed: 09/03/2012 Elsevier Interactive Patient Education 2016 Elsevier Inc.   Benign Positional Vertigo Vertigo is the feeling that you or your surroundings are moving when they are not. Benign positional vertigo is the most common form of vertigo. The cause of this condition is not serious (is benign). This condition is triggered by certain movements and positions (is positional). This condition can be dangerous if it occurs while you are doing something that could endanger you or others, such as driving.  CAUSES In many cases, the cause of this condition is not known. It may be caused by a disturbance in an area of the inner ear that helps your brain to sense movement and balance. This disturbance can be caused by a viral infection (labyrinthitis), head injury, or repetitive motion. RISK FACTORS This condition is more likely to develop in:  Women.  People who are 50 years of  age or older. SYMPTOMS Symptoms of this condition usually happen when you move your head or your eyes in different directions. Symptoms may start suddenly, and they usually last for less than a minute. Symptoms may include:  Loss of balance and falling.  Feeling like you are spinning or moving.  Feeling like your surroundings are spinning or moving.  Nausea and vomiting.  Blurred vision.  Dizziness.  Involuntary eye movement (nystagmus). Symptoms can be mild and cause only slight annoyance, or they can be severe and interfere with daily life. Episodes of benign positional vertigo may return (recur) over time, and they may be triggered by certain movements. Symptoms may improve over time. DIAGNOSIS This condition is usually diagnosed by medical history and a physical exam of the head, neck, and ears. You may be referred to a health care provider who specializes in ear, nose, and throat (ENT) problems (otolaryngologist) or a provider who specializes in disorders  of the nervous system (neurologist). You may have additional testing, including:  MRI.  A CT scan.  Eye movement tests. Your health care provider may ask you to change positions quickly while he or she watches you for symptoms of benign positional vertigo, such as nystagmus. Eye movement may be tested with an electronystagmogram (ENG), caloric stimulation, the Dix-Hallpike test, or the roll test.  An electroencephalogram (EEG). This records electrical activity in your brain.  Hearing tests. TREATMENT Usually, your health care provider will treat this by moving your head in specific positions to adjust your inner ear back to normal. Surgery may be needed in severe cases, but this is rare. In some cases, benign positional vertigo may resolve on its own in 2-4 weeks. HOME CARE INSTRUCTIONS Safety  Move slowly.Avoid sudden body or head movements.  Avoid driving.  Avoid operating heavy machinery.  Avoid doing any tasks that would be dangerous to you or others if a vertigo episode would occur.  If you have trouble walking or keeping your balance, try using a cane for stability. If you feel dizzy or unstable, sit down right away.  Return to your normal activities as told by your health care provider. Ask your health care provider what activities are safe for you. General Instructions  Take over-the-counter and prescription medicines only as told by your health care provider.  Avoid certain positions or movements as told by your health care provider.  Drink enough fluid to keep your urine clear or pale yellow.  Keep all follow-up visits as told by your health care provider. This is important. SEEK MEDICAL CARE IF:  You have a fever.  Your condition gets worse or you develop new symptoms.  Your family or friends notice any behavioral changes.  Your nausea or vomiting gets worse.  You have numbness or a "pins and needles" sensation. SEEK IMMEDIATE MEDICAL CARE IF:  You have  difficulty speaking or moving.  You are always dizzy.  You faint.  You develop severe headaches.  You have weakness in your legs or arms.  You have changes in your hearing or vision.  You develop a stiff neck.  You develop sensitivity to light.   This information is not intended to replace advice given to you by your health care provider. Make sure you discuss any questions you have with your health care provider.   Document Released: 11/19/2005 Document Revised: 11/02/2014 Document Reviewed: 06/06/2014 Elsevier Interactive Patient Education [employer]     IF you received an x-ray today, you will receive an invoice  from [facility]. Please contact [facility] at [phone] with questions or concerns regarding your invoice.   IF you received labwork today, you will receive an invoice from [employer]. Please contact [facility] at [phone] with questions or concerns regarding your invoice.   Our billing staff will not be able to assist you with questions regarding bills from these companies.  You will be contacted with the lab results as soon as they are available. The fastest way to get your results is to activate your My Chart account. Instructions are located on the last page of this paperwork. If you have not heard from [place] regarding the results in 2 weeks, please contact this office.

## 2015-06-13 ENCOUNTER — Ambulatory Visit (INDEPENDENT_AMBULATORY_CARE_PROVIDER_SITE_OTHER): Payer: PRIVATE HEALTH INSURANCE | Admitting: Family Medicine

## 2015-06-13 ENCOUNTER — Encounter: Payer: Self-pay | Admitting: Family Medicine

## 2015-06-13 VITALS — BP 126/80 | HR 77 | Wt 190.0 lb

## 2015-06-13 DIAGNOSIS — S62501A Fracture of unspecified phalanx of right thumb, initial encounter for closed fracture: Secondary | ICD-10-CM | POA: Diagnosis not present

## 2015-06-13 DIAGNOSIS — R42 Dizziness and giddiness: Secondary | ICD-10-CM | POA: Diagnosis not present

## 2015-06-13 DIAGNOSIS — S0011XA Contusion of right eyelid and periocular area, initial encounter: Secondary | ICD-10-CM

## 2015-06-13 DIAGNOSIS — IMO0002 Reserved for concepts with insufficient information to code with codable children: Secondary | ICD-10-CM | POA: Insufficient documentation

## 2015-06-13 DIAGNOSIS — S060X1A Concussion with loss of consciousness of 30 minutes or less, initial encounter: Secondary | ICD-10-CM

## 2015-06-13 DIAGNOSIS — L03011 Cellulitis of right finger: Secondary | ICD-10-CM

## 2015-06-13 MED ORDER — DOXYCYCLINE HYCLATE 100 MG PO TABS: 100.0000 mg | ORAL_TABLET | Freq: Two times a day (BID) | ORAL | Status: DC

## 2015-06-13 NOTE — Patient Instructions (Signed)
Thank you for coming in today. Return in 2 weeks for thumb.  Take doxycycline antibiotics.  Use the splint.
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
## 2015-06-27 ENCOUNTER — Ambulatory Visit (INDEPENDENT_AMBULATORY_CARE_PROVIDER_SITE_OTHER): Payer: PRIVATE HEALTH INSURANCE | Admitting: Family Medicine

## 2015-06-27 ENCOUNTER — Encounter: Payer: Self-pay | Admitting: Family Medicine

## 2015-06-27 ENCOUNTER — Ambulatory Visit (INDEPENDENT_AMBULATORY_CARE_PROVIDER_SITE_OTHER): Payer: PRIVATE HEALTH INSURANCE

## 2015-06-27 VITALS — BP 132/84 | HR 82 | Wt 190.0 lb

## 2015-06-27 DIAGNOSIS — X58XXXD Exposure to other specified factors, subsequent encounter: Secondary | ICD-10-CM | POA: Diagnosis not present

## 2015-06-27 DIAGNOSIS — L03011 Cellulitis of right finger: Secondary | ICD-10-CM | POA: Diagnosis not present

## 2015-06-27 DIAGNOSIS — S0011XA Contusion of right eyelid and periocular area, initial encounter: Secondary | ICD-10-CM | POA: Diagnosis not present

## 2015-06-27 DIAGNOSIS — S62501A Fracture of unspecified phalanx of right thumb, initial encounter for closed fracture: Secondary | ICD-10-CM

## 2015-06-27 DIAGNOSIS — S62521G Displaced fracture of distal phalanx of right thumb, subsequent encounter for fracture with delayed healing: Secondary | ICD-10-CM

## 2015-06-27 DIAGNOSIS — S060X1A Concussion with loss of consciousness of 30 minutes or less, initial encounter: Secondary | ICD-10-CM

## 2015-06-27 DIAGNOSIS — R42 Dizziness and giddiness: Secondary | ICD-10-CM

## 2015-06-27 NOTE — Assessment & Plan Note (Signed)
Improved

## 2015-06-27 NOTE — Patient Instructions (Signed)
Thank you for coming in today. Return in 4 weeks.  Continue the brace.

## 2015-06-27 NOTE — Assessment & Plan Note (Signed)
Slight displacement seen. Continue stack splint. Recheck in 4 weeks.

## 2015-06-27 NOTE — Progress Notes (Signed)
       Dan ConesRobert L Brady is a 30 y.o. male who [name] today for follow-up right thumb pain and concussion.  Patient was seen recently for right thumb pain thought to be due to fracture at the distal phalanx and paronychia. He notes his pain is improved following antibiotics for paronychia.  He notes some continued thumb pain at the fracture site especially with thumb motion. Overall he feels much improved. He notes that he's been wearing the stack splint intermittently.   He has his headache is improved from concussion. He notes very minimal occasional dizziness with head motion. He feels much better.   Past Medical History  Diagnosis Date  . Environmental allergies   . Allergy    No past surgical history on file. Social History  Substance Use Topics  . Smoking status: Current Every Day Smoker -- 0.75 packs/day for 10 years    Types: Cigarettes  . Smokeless tobacco: Not on file  . Alcohol Use: Yes     Comment: occasional   family history includes Diabetes in his mother; Hypertension in his mother.  ROS as above Medications: Current Outpatient Prescriptions  Medication Sig Dispense Refill  . doxycycline (VIBRA-TABS) 100 MG tablet Take 1 tablet (100 mg total) by mouth 2 (two) times daily. 20 tablet 0   No current facility-administered medications for this visit.   No Known Allergies   Exam:  BP 132/84 mmHg  Pulse 82  Wt 190 lb (86.183 kg) Gen: Well NAD HEENT: EOMI,  MMMNo significant eye swelling or ecchymosis. PERRLA  Right thumb nonerythematous mildly tender at the interphalangeal joint. Normal motion. Pulses capillary refill and sensation are intact distally. Neuro: Alert and oriented normal speech thought process affect and balance and gait.  X-ray right thumb: Mildly displaced comminuted fracture of the proximal end of the distal phalanx.  No results  found for this or any previous visit (from the past 24 hour(s)). No results found.   Please see individual assessment and plan sections.

## 2015-06-27 NOTE — Assessment & Plan Note (Signed)
Improving. Continue to follow.

## 2015-06-27 NOTE — Assessment & Plan Note (Signed)
Resolved

## 2015-07-25 ENCOUNTER — Ambulatory Visit: Payer: PRIVATE HEALTH INSURANCE | Admitting: Family Medicine

## 2015-08-24 ENCOUNTER — Ambulatory Visit (INDEPENDENT_AMBULATORY_CARE_PROVIDER_SITE_OTHER): Payer: PRIVATE HEALTH INSURANCE

## 2015-08-24 ENCOUNTER — Ambulatory Visit (INDEPENDENT_AMBULATORY_CARE_PROVIDER_SITE_OTHER): Payer: PRIVATE HEALTH INSURANCE | Admitting: Family Medicine

## 2015-08-24 ENCOUNTER — Encounter: Payer: Self-pay | Admitting: Family Medicine

## 2015-08-24 VITALS — BP 153/91 | HR 101 | Wt 184.0 lb

## 2015-08-24 DIAGNOSIS — S62521D Displaced fracture of distal phalanx of right thumb, subsequent encounter for fracture with routine healing: Secondary | ICD-10-CM

## 2015-08-24 DIAGNOSIS — X58XXXD Exposure to other specified factors, subsequent encounter: Secondary | ICD-10-CM | POA: Diagnosis not present

## 2015-08-24 DIAGNOSIS — S62501A Fracture of unspecified phalanx of right thumb, initial encounter for closed fracture: Secondary | ICD-10-CM | POA: Diagnosis not present

## 2015-08-24 NOTE — Patient Instructions (Signed)
Thank you for coming in today. Return as needed.  Resume work.

## 2015-08-24 NOTE — Progress Notes (Signed)
       Dan ConesRobert L Brady is a 30 y.o. male who [facility] today for Follow-up right thumb fracture. Patient was last seen on May 2 for a fracture involving the distal phalanx of his right thumb. He was lost to follow-up.  Currently he is feeling much better and is completely asymptomatic.  He would like a work note to return to work full duty with no restriction.   Past Medical History  Diagnosis Date  . Environmental allergies   . Allergy    No past surgical history on file. Social History  Substance Use Topics  . Smoking status: Current Every Day Smoker -- 0.75 packs/day for 10 years    Types: Cigarettes  . Smokeless tobacco: Not on file  . Alcohol Use: Yes     Comment: occasional   family history includes Diabetes in his mother; Hypertension in his mother.  ROS as above:  Medications: No current outpatient prescriptions on file.   No current facility-administered medications for this visit.   No Known Allergies   Exam:  BP 153/91 mmHg  Pulse 101  Wt 184 lb (83.462 kg) Gen: Well NAD Right thumb: Normal-appearing nontender normal motion  X-ray right thumb shows healed distal phalanx fracture. Awaiting formal radiology review.  No results found for this or any previous visit (from the past 24 hour(s)). No results found.    Assessment and Plan: 30 y.o. male with right thumb fracture. Healed.Return to work full duties. Work note provided. Return as needed.  Discussed warning signs or symptoms. Please see discharge instructions. Patient expresses understanding.

## 2015-08-25 NOTE — Progress Notes (Signed)
Quick Note:  Fracture is healing ______

## 2015-12-25 ENCOUNTER — Ambulatory Visit: Payer: PRIVATE HEALTH INSURANCE | Admitting: Family Medicine

## 2015-12-26 ENCOUNTER — Ambulatory Visit: Payer: PRIVATE HEALTH INSURANCE | Admitting: Family Medicine

## 2015-12-28 ENCOUNTER — Ambulatory Visit (INDEPENDENT_AMBULATORY_CARE_PROVIDER_SITE_OTHER): Payer: PRIVATE HEALTH INSURANCE | Admitting: Family Medicine

## 2015-12-28 DIAGNOSIS — M25562 Pain in left knee: Secondary | ICD-10-CM

## 2015-12-28 DIAGNOSIS — M2242 Chondromalacia patellae, left knee: Secondary | ICD-10-CM | POA: Insufficient documentation

## 2015-12-28 MED ORDER — DICLOFENAC SODIUM 1 % TD GEL: 4.0000 g | Freq: Four times a day (QID) | TRANSDERMAL | 11 refills | Status: DC

## 2015-12-28 MED ORDER — NAPROXEN 500 MG PO TABS: 500.0000 mg | ORAL_TABLET | Freq: Two times a day (BID) | ORAL | 0 refills | Status: DC | PRN

## 2015-12-28 MED ORDER — OMEPRAZOLE 40 MG PO CPDR: 40.0000 mg | DELAYED_RELEASE_CAPSULE | Freq: Every day | ORAL | 1 refills | Status: DC

## 2015-12-28 NOTE — Patient Instructions (Signed)
Thank you for coming in today. Get xray today.  Take naproxen twice daily and use diclofenac gel 4 times daily for pain. Use of omeprazole daily to prevent stomach ulcers. Return in a few weeks if not better.    Meniscus Tear With Phase I Rehab The meniscus is a C-shaped cartilage structure, located in the knee joint between the thigh bone (femur) and the shinbone (tibia). Two menisci are located in each knee joint: the inner and outer meniscus. The meniscus acts as an adapter between the thigh bone and shinbone, allowing them to fit properly together. It also functions as a shock absorber, to reduce the stress placed on the knee joint and to help supply nutrients to the knee joint cartilage. As people age, the meniscus begins to harden and become more vulnerable to injury. Meniscus tears are a common injury, especially in older athletes. Inner meniscus tears are more common than outer meniscus tears.  SYMPTOMS   Pain in the knee, especially with standing or squatting with the affected leg.  Tenderness along the joint line.  Swelling in the knee joint (effusion), usually starting 1 to 2 days after injury.  Locking or catching of the knee joint, causing inability to straighten the knee completely.  Giving way or buckling of the knee. CAUSES  A meniscus tear occurs when a force is placed on the meniscus that is greater than it can handle. Common causes of injury include:  Direct hit (trauma) to the knee.  Twisting, pivoting, or cutting (rapidly changing direction while running), kneeling or squatting.  Without injury, due to aging. RISK INCREASES WITH:  Contact sports (football, rugby).  Sports in which cleats are used with pivoting (soccer, lacrosse) or sports in which good shoe grip and sudden change in direction are required (racquetball, basketball, squash).  Previous knee injury.  Associated knee injury, particularly ligament injuries.  Poor strength and  flexibility. PREVENTION  Warm up and stretch properly before activity.  Maintain physical fitness:  Strength, flexibility, and endurance.  Cardiovascular fitness.  Protect the knee with a brace or elastic bandage.  Wear properly fitted protective equipment (proper cleats for the surface). PROGNOSIS  Sometimes, meniscus tears heal on their own. However, definitive treatment requires surgery, followed by at least 6 weeks of recovery.  RELATED COMPLICATIONS   Recurring symptoms that result in a chronic problem.  Repeated knee injury, especially if sports are resumed too soon after injury or surgery.  Progression of the tear (the tear gets larger), if untreated.  Arthritis of the knee in later years (with or without surgery).  Complications of surgery, including infection, bleeding, injury to nerves (numbness, weakness, paralysis) continued pain, giving way, locking, nonhealing of meniscus (if repaired), need for further surgery, and knee stiffness (loss of motion). TREATMENT  Treatment first involves the use of ice and medicine, to reduce pain and inflammation. You may find using crutches to walk more comfortable. However, it is okay to bear weight on the injured knee, if the pain will allow it. Surgery is often advised as a definitive treatment. Surgery is performed through an incision near the joint (arthroscopically). The torn piece of the meniscus is removed, and if possible the joint cartilage is repaired. After surgery, the joint must be restrained. After restraint, it is important to perform strengthening and stretching exercises to help regain strength and a full range of motion. These exercises may be completed at home or with a therapist.  MEDICATION  If pain medicine is needed, nonsteroidal anti-inflammatory medicines (  aspirin and ibuprofen), or other minor pain relievers (acetaminophen), are often advised.  Do not take pain medicine for 7 days before surgery.  Prescription  pain relievers may be given, if your caregiver thinks they are needed. Use only as directed and only as much as you need. HEAT AND COLD  Cold treatment (icing) should be applied for 10 to 15 minutes every 2 to 3 hours for inflammation and pain, and immediately after activity that aggravates your symptoms. Use ice packs or an ice massage.  Heat treatment may be used before performing stretching and strengthening activities prescribed by your caregiver, physical therapist, or athletic trainer. Use a heat pack or a warm water soak. SEEK MEDICAL CARE IF:   Symptoms get worse or do not improve in 2 weeks, despite treatment.  New, unexplained symptoms develop. (Drugs used in treatment may produce side effects.) EXERCISES RANGE OF MOTION (ROM) AND STRETCHING EXERCISES - Meniscus Tear, Non-operative, Phase I These are some of the initial exercises with which you may start your rehabilitation program, until you see your caregiver again or until your symptoms are resolved. Remember:   These initial exercises are intended to be gentle. They will help you restore motion without increasing any swelling.  Completing these exercises allows less painful movement and prepares you for the more aggressive strengthening exercises in Phase II.  An effective stretch should be held for at least 30 seconds.  A stretch should never be painful. You should only feel a gentle lengthening or release in the stretched tissue. RANGE OF MOTION - Knee Flexion, Active  Lie on your back with both knees straight. (If this causes back discomfort, bend your healthy knee, placing your foot flat on the floor.)  Slowly slide your heel back toward your buttocks until you feel a gentle stretch in the front of your knee or thigh.  Hold for __________ seconds. Slowly slide your heel back to the starting position. Repeat __________ times. Complete this exercise __________ times per day.  RANGE OF MOTION - Knee Flexion and Extension,  Active-Assisted  Sit on the edge of a table or chair with your thighs firmly supported. It may be helpful to place a folded towel under the end of your right / left thigh.  Flexion (bending): Place the ankle of your healthy leg on top of the other ankle. Use your healthy leg to gently bend your right / left knee until you feel a mild tension across the top of your knee.  Hold for __________ seconds.  Extension (straightening): Switch your ankles so your right / left leg is on top. Use your healthy leg to straighten your right / left knee until you feel a mild tension on the backside of your knee.  Hold for __________ seconds. Repeat __________ times. Complete __________ times per day. STRETCH - Knee Flexion, Supine  Lie on the floor with your right / left heel and foot lightly touching the wall. (Place both feet on the wall if you do not use a door frame.)  Without using any effort, allow gravity to slide your foot down the wall slowly until you feel a gentle stretch in the front of your right / left knee.  Hold this stretch for __________ seconds. Then return the leg to the starting position, using your healthy leg for help, if needed. Repeat __________ times. Complete this stretch __________ times per day.  STRETCH - Knee Extension Sitting  Sit with your right / left leg/heel propped on another chair, coffee  table, or foot stool.  Allow your leg muscles to relax, letting gravity straighten out your knee.*  You should feel a stretch behind your right / left knee. Hold this position for __________ seconds. Repeat __________ times. Complete this stretch __________ times per day.  *Your physician, physical therapist or athletic trainer may instruct you place a __________ weight on your thigh, just above your kneecap, to deepen the stretch.  STRENGTHENING EXERCISES - Meniscus Tear, Non-operative, Phase I These exercises may help you when beginning to rehabilitate your injury. They may  resolve your symptoms with or without further involvement from your physician, physical therapist or athletic trainer. While completing these exercises, remember:   Muscles can gain both the endurance and the strength needed for everyday activities through controlled exercises.  Complete these exercises as instructed by your physician, physical therapist or athletic trainer. Progress the resistance and repetitions only as guided. STRENGTH - Quadriceps, Isometrics  Lie on your back with your right / left leg extended and your opposite knee bent.  Gradually tense the muscles in the front of your right / left thigh. You should see either your knee cap slide up toward your hip or increased dimpling just above the knee. This motion will push the back of the knee down toward the floor, mat, or bed on which you are lying.  Hold the muscle as tight as you can, without increasing your pain, for __________ seconds.  Relax the muscles slowly and completely between each repetition. Repeat __________ times. Complete this exercise __________ times per day.  STRENGTH - Quadriceps, Short Arcs   Lie on your back. Place a __________ inch towel roll under your right / left knee, so that the knee bends slightly.  Raise only your lower leg by tightening the muscles in the front of your thigh. Do not allow your thigh to rise.  Hold this position for __________ seconds. Repeat __________ times. Complete this exercise __________ times per day.  OPTIONAL ANKLE WEIGHTS: Begin with ____________________, but DO NOT exceed ____________________. Increase in 1 pound/0.5 kilogram increments. STRENGTH - Quadriceps, Straight Leg Raises  Quality counts! Watch for signs that the quadriceps muscle is working, to be sure you are strengthening the correct muscles and not "cheating" by substituting with healthier muscles.  Lay on your back with your right / left leg extended and your opposite knee bent.  Tense the muscles in  the front of your right / left thigh. You should see either your knee cap slide up or increased dimpling just above the knee. Your thigh may even shake a bit.  Tighten these muscles even more and raise your leg 4 to 6 inches off the floor. Hold for __________ seconds.  Keeping these muscles tense, lower your leg.  Relax the muscles slowly and completely in between each repetition. Repeat __________ times. Complete this exercise __________ times per day.  STRENGTH - Hamstring, Curls   Lay on your stomach with your legs extended. (If you lay on a bed, your feet may hang over the edge.)  Tighten the muscles in the back of your thigh to bend your right / left knee up to 90 degrees. Keep your hips flat on the bed.  Hold this position for __________ seconds.  Slowly lower your leg back to the starting position. Repeat __________ times. Complete this exercise __________ times per day.  STRENGTH - Quadriceps, Squats  Stand in a door frame so that your feet and knees are in line with the frame.  Use your hands for balance, not support, on the frame.  Slowly lower your weight, bending at the hips and knees. Keep your lower legs upright so that they are parallel with the door frame. Squat only within the range that does not increase your knee pain. Never let your hips drop below your knees.  Slowly return upright, pushing with your legs, not pulling with your hands. Repeat __________ times. Complete this exercise __________ times per day.  STRENGTH - Quad/VMO, Isometric   Sit in a chair with your right / left knee slightly bent. With your fingertips, feel the VMO muscle just above the inside of your knee. The VMO is important in controlling the position of your kneecap.  Keeping your fingertips on this muscle. Without actually moving your leg, attempt to drive your knee down as if straightening your leg. You should feel your VMO tense. If you have a difficult time, you may wish to try the same  exercise on your healthy knee first.  Tense this muscle as hard as you can without increasing any knee pain.  Hold for __________ seconds. Relax the muscles slowly and completely in between each repetition. Repeat __________ times. Complete exercise __________ times per day.    This information is not intended to replace advice given to you by your health care provider. Make sure you discuss any questions you have with your health care provider.   Document Released: 02/25/1998 Document Revised: 06/28/2014 Document Reviewed: 05/26/2008 Elsevier Interactive Patient Education [employer]

## 2015-12-28 NOTE — Progress Notes (Signed)
Dan Brady is a 30 y.o. male who [facility] today for left knee pain. Patient has a two-week history of left knee pain associated with locking catching clicking and popping. He denies any injury or swelling. He notes a few years ago he did suffer any injury that resulted in a knee effusion but resolved on its own. He's used some over-the-counter medicines which have helped a bit. He denies any fevers or chills. Last week he had an episode of vomiting and abdominal pain that he attributes to a "stomach bug".  His belly pain has since resolved.   Past Medical History:  Diagnosis Date  . Allergy   . Environmental allergies    No past surgical history on file. Social History  Substance Use Topics  . Smoking status: Current Every Day Smoker    Packs/day: 0.75    Years: 10.00    Types: Cigarettes  . Smokeless tobacco: Not on file  . Alcohol use Yes     Comment: occasional     ROS:  As above   Medications: Current Outpatient Prescriptions  Medication Sig Dispense Refill  . diclofenac sodium (VOLTAREN) 1 % GEL Apply 4 g topically 4 (four) times daily. To affected joint. 100 g 11  . naproxen (NAPROSYN) 500 MG tablet Take 1 tablet (500 mg total) by mouth 2 (two) times daily as needed for moderate pain. 30 tablet 0  . omeprazole (PRILOSEC) 40 MG capsule Take 1 capsule (40 mg total) by mouth daily. 30 capsule 1   No current facility-administered medications for this visit.    No Known Allergies   Exam:  BP 131/87   Pulse 93   Wt 175 lb (79.4 kg)   BMI 25.84 kg/m  General: Well Developed, well nourished, and in no acute distress.  Neuro/Psych: Alert and oriented x3, extra-ocular muscles intact, able to move all 4 extremities, sensation grossly intact. Skin: Warm and dry, no rashes noted.  Respiratory: Not using accessory muscles, speaking in full sentences, trachea midline.  Cardiovascular: Pulses palpable, no  extremity edema. Abdomen: Does not appear distended. MSK: Left knee normal appearing without effusion. Normal motion 0-120 without crepitations. Nontender. Lax anterior drawer test compared to contralateral right knee. No firm endpoint noted. Normal posterior drawer test. Normal valgus and varus stress test. Negative McMurray's test. Normal gait. Normal extension and flexion strength.   X-ray left knee pending    No results found for this or any previous visit (from the past 48 hour(s)). No results found.    Assessment and Plan: 30 y.o. male with  Left knee pain for 2 weeks without clear injury. Possible exacerbation of existing injury. I'm concerned patient has suffered an anterior cruciate ligament injury or a meniscus injury in the past that has been exacerbated recently. We do a trial of conservative management with naproxen diclofenac gel and home exercise program. Additionally we'll prescribe omeprazole for GI prophylaxis. If not better in a few weeks recommend MRI to evaluate lax anterior drawer test. I'm concerned he has an anterior cruciate ligament injury.    Orders Placed This Encounter  Procedures  . DG Knee 1-2 Views Right    Standing Status:   Future    Standing Expiration Date:   02/27/2017    Order Specific Question:   Reason for Exam (SYMPTOM  OR DIAGNOSIS REQUIRED)    Answer:   For use with the left knee x-ray bilateral AP and Rosenberg standing.  Order Specific Question:   Preferred imaging location?    Answer:   [facility]  . DG Knee Complete 4 Views Left    Please include patellar sunrise, lateral, and weightbearing bilateral AP and bilateral rosenberg views    Standing Status:   Future    Standing Expiration Date:   02/26/2017    Order Specific Question:   Reason for exam:    Answer:   Please include patellar sunrise, lateral, and weightbearing bilateral AP and bilateral rosenberg views    Comments:   Please include patellar sunrise,  lateral, and weightbearing bilateral AP and bilateral rosenberg views    Order Specific Question:   Preferred imaging location?    Answer:   [facility]    Discussed warning signs or symptoms. Please see discharge instructions. Patient expresses understanding.

## 2016-01-01 ENCOUNTER — Telehealth: Payer: Self-pay | Admitting: *Deleted

## 2016-01-01 NOTE — Telephone Encounter (Signed)
Called patient and left vm that diclofenac will not be covered by insurance. Can go to [email or web address] and print coupon. Cost of med around $30

## 2016-02-28 ENCOUNTER — Encounter: Payer: Self-pay | Admitting: Family Medicine

## 2016-02-28 ENCOUNTER — Ambulatory Visit (INDEPENDENT_AMBULATORY_CARE_PROVIDER_SITE_OTHER): Payer: PRIVATE HEALTH INSURANCE

## 2016-02-28 ENCOUNTER — Ambulatory Visit (INDEPENDENT_AMBULATORY_CARE_PROVIDER_SITE_OTHER): Payer: PRIVATE HEALTH INSURANCE | Admitting: Family Medicine

## 2016-02-28 VITALS — BP 127/80 | HR 82 | Wt 187.0 lb

## 2016-02-28 DIAGNOSIS — M25562 Pain in left knee: Secondary | ICD-10-CM

## 2016-02-28 DIAGNOSIS — F17200 Nicotine dependence, unspecified, uncomplicated: Secondary | ICD-10-CM | POA: Diagnosis not present

## 2016-02-28 DIAGNOSIS — E663 Overweight: Secondary | ICD-10-CM | POA: Diagnosis not present

## 2016-02-28 MED ORDER — VARENICLINE TARTRATE 0.5 MG X 11 & 1 MG X 42 PO MISC: ORAL | 0 refills | Status: DC

## 2016-02-28 MED ORDER — VARENICLINE TARTRATE 1 MG PO TABS: 1.0000 mg | ORAL_TABLET | Freq: Two times a day (BID) | ORAL | 3 refills | Status: DC

## 2016-02-28 NOTE — Patient Instructions (Signed)
Thank you for coming in today. Get MRI of the knee soon.  Return a few days after the MRI has been done.  Start chantix for smoking.   Get fasting labs before the next visit.   Varenicline oral tablets What is this medicine? VARENICLINE (var EN i kleen) is used to help people quit smoking. It can reduce the symptoms caused by stopping smoking. It is used with a patient support program recommended by your physician. This medicine may be used for other purposes; ask your health care provider or pharmacist if you have questions. COMMON BRAND NAME(S): Chantix What should I tell my health care provider before I take this medicine? They need to know if you have any of these conditions: -bipolar disorder, depression, schizophrenia or other mental illness -heart disease -if you often drink alcohol -kidney disease -peripheral vascular disease -seizures -stroke -suicidal thoughts, plans, or attempt; a previous suicide attempt by you or a family member -an unusual or allergic reaction to varenicline, other medicines, foods, dyes, or preservatives -pregnant or trying to get pregnant -breast-feeding How should I use this medicine? Take this medicine by mouth after eating. Take with a full glass of water. Follow the directions on the prescription label. Take your doses at regular intervals. Do not take your medicine more often than directed. There are 3 ways you can use this medicine to help you quit smoking; talk to your health care professional to decide which plan is right for you: 1) you can choose a quit date and start this medicine 1 week before the quit date, or, 2) you can start taking this medicine before you choose a quit date, and then pick a quit date between day 8 and 35 days of treatment, or, 3) if you are not sure that you are able or willing to quit smoking right away, start taking this medicine and slowly decrease the amount you smoke as directed by your health care professional with  the goal of being cigarette-free by week 12 of treatment. Stick to your plan; ask about support groups or other ways to help you remain cigarette-free. If you are motivated to quit smoking and did not succeed during a previous attempt with this medicine for reasons other than side effects, or if you returned to smoking after this treatment, speak with your health care professional about whether another course of this medicine may be right for you. A special MedGuide will be given to you by the pharmacist with each prescription and refill. Be sure to read this information carefully each time. Talk to your pediatrician regarding the use of this medicine in children. This medicine is not approved for use in children. Overdosage: If you think you have taken too much of this medicine contact a poison control center or emergency room at once. NOTE: This medicine is only for you. Do not share this medicine with others. What if I miss a dose? If you miss a dose, take it as soon as you can. If it is almost time for your next dose, take only that dose. Do not take double or extra doses. What may interact with this medicine? -alcohol or any product that contains alcohol -insulin -other stop smoking aids -theophylline -warfarin This list may not describe all possible interactions. Give your health care provider a list of all the medicines, herbs, non-prescription drugs, or dietary supplements you use. Also tell them if you smoke, drink alcohol, or use illegal drugs. Some items may interact with your medicine. What  should I watch for while using this medicine? Visit your doctor or health care professional for regular check ups. Ask for ongoing advice and encouragement from your doctor or healthcare professional, friends, and family to help you quit. If you smoke while on this medication, quit again Your mouth may get dry. Chewing sugarless gum or sucking hard candy, and drinking plenty of water may help. Contact  your doctor if the problem does not go away or is severe. You may get drowsy or dizzy. Do not drive, use machinery, or do anything that needs mental alertness until you know how this medicine affects you. Do not stand or sit up quickly, especially if you are an older patient. This reduces the risk of dizzy or fainting spells. Sleepwalking can happen during treatment with this medicine, and can sometimes lead to behavior that is harmful to you, other people, or property. Stop taking this medicine and tell your doctor if you start sleepwalking or have other unusual sleep-related activity. Decrease the amount of alcoholic beverages that you drink during treatment with this medicine until you know if this medicine affects your ability to tolerate alcohol. Some people have experienced increased drunkenness (intoxication), unusual or sometimes aggressive behavior, or no memory of things that have happened (amnesia) during treatment with this medicine. The use of this medicine may increase the chance of suicidal thoughts or actions. Pay special attention to how you are responding while on this medicine. Any worsening of mood, or thoughts of suicide or dying should be reported to your health care professional right away. What side effects may I notice from receiving this medicine? Side effects that you should report to your doctor or health care professional as soon as possible: -allergic reactions like skin rash, itching or hives, swelling of the face, lips, tongue, or throat -acting aggressive, being angry or violent, or acting on dangerous impulses -breathing problems -changes in vision -chest pain or chest tightness -confusion, trouble speaking or understanding -new or worsening depression, anxiety, or panic attacks -extreme increase in activity and talking (mania) -fast, irregular heartbeat -feeling faint or lightheaded, falls -fever -pain in legs when walking -problems with balance, talking,  walking -redness, blistering, peeling or loosening of the skin, including inside the mouth -ringing in ears -seeing or hearing things that aren't there (hallucinations) -seizures -sleepwalking -sudden numbness or weakness of the face, arm or leg -thoughts about suicide or dying, or attempts to commit suicide -trouble passing urine or change in the amount of urine -unusual bleeding or bruising -unusually weak or tired Side effects that usually do not require medical attention (report to your doctor or health care professional if they continue or are bothersome): -constipation -headache -nausea, vomiting -strange dreams -stomach gas -trouble sleeping This list may not describe all possible side effects. Call your doctor for medical advice about side effects. You may report side effects to FDA at 1-800-FDA-1088. Where should I keep my medicine? Keep out of the reach of children. Store at room temperature between 15 and 30 degrees C (59 and 86 degrees F). Throw away any unused medicine after the expiration date. NOTE: This sheet is a summary. It may not cover all possible information. If you have questions about this medicine, talk to your doctor, pharmacist, or health care provider.  2017 Elsevier/Gold Standard (2014-10-27 16:14:23)

## 2016-02-28 NOTE — Progress Notes (Signed)
Dan Brady is a 31 y.o. male who [name] today for discuss knee pain smoking and screening labs.  Knee pain: Patient continues to experience left knee pain and instability. This is occasionally associated with locking and catching. He has tried rest oral NSAIDs and home therapy which has not helped. The knee pain is interfering with his quality of life and his ability to exercise.  Smoking: Patient is currently smoking daily. He wishes to quit and has tried quitting in the past with over-the-counter methods. He is interested in Chantix.  Screening labs: Patient is interested in checking his cholesterol as well as liver and kidney function.   Past Medical History:  Diagnosis Date  . Allergy   . Environmental allergies    No past surgical history on file. Social History  Substance Use Topics  . Smoking status: Current Every Day Smoker    Packs/day: 0.75    Years: 10.00    Types: Cigarettes  . Smokeless tobacco: Not on file  . Alcohol use Yes     Comment: occasional   family history includes Diabetes in his father and mother; Hypertension in his father and mother.  ROS as above:  Medications: Current Outpatient Prescriptions  Medication Sig Dispense Refill  . diclofenac sodium (VOLTAREN) 1 % GEL Apply 4 g topically 4 (four) times daily. To affected joint. 100 g 11  . naproxen (NAPROSYN) 500 MG tablet Take 1 tablet (500 mg total) by mouth 2 (two) times daily as needed for moderate pain. 30 tablet 0  . omeprazole (PRILOSEC) 40 MG capsule Take 1 capsule (40 mg total) by mouth daily. 30 capsule 1  . varenicline (CHANTIX STARTING MONTH PAK) 0.5 MG X 11 & 1 MG X 42 tablet Take one 0.5mg  tablet by mouth once daily for 3 days, then increase to one 0.5mg  tablet twice daily for 3 days, then increase to one 1mg  tablet twice daily. 53 tablet 0  .  varenicline (CHANTIX) 1 MG tablet Take 1 tablet (1 mg total) by mouth 2 (two) times daily. 60 tablet 3   No current facility-administered medications for this visit.    No Known Allergies  Health Maintenance Health Maintenance  Topic Date Due  . INFLUENZA VACCINE  10/26/2017 (Originally 09/26/2015)  . TETANUS/TDAP  09/28/2023  . HIV Screening  Completed     Exam:  BP 127/80   Pulse 82   Wt 187 lb (84.8 kg)   BMI 27.62 kg/m  Gen: Well NAD HEENT: EOMI,  MMM Lungs: Normal work of breathing. CTABL Heart: RRR no MRG Abd: NABS, Soft. Nondistended, Nontender Exts: Brisk capillary refill, warm and well perfused.  Left knee: Positive anterior drawer test and medial McMurray's test. Otherwise knee exam is unremarkable.   No results found for this or any previous visit (from the past 72 hour(s)). Dg Knee 1-2 Views Left  Result Date: 02/28/2016 CLINICAL DATA:  Left knee pain for 6 months EXAM: LEFT KNEE - 1-2 VIEW; LEFT KNEE - COMPLETE 4+ VIEW COMPARISON:  None. FINDINGS: Slight joint space narrowing in the medial compartment of the left knee. No acute bony abnormality. Specifically, no fracture, subluxation, or dislocation. Soft tissues are intact. No joint effusion. IMPRESSION: Slight joint space narrowing in the medial compartment of the left knee. Electronically Signed   By: Charlett NoseKevin  Dover M.D.   On: 02/28/2016 11:46   Dg Knee Complete 4 Views Left  Result Date: 02/28/2016  CLINICAL DATA:  Left knee pain for 6 months EXAM: LEFT KNEE - 1-2 VIEW; LEFT KNEE - COMPLETE 4+ VIEW COMPARISON:  None. FINDINGS: Slight joint space narrowing in the medial compartment of the left knee. No acute bony abnormality. Specifically, no fracture, subluxation, or dislocation. Soft tissues are intact. No joint effusion. IMPRESSION: Slight joint space narrowing in the medial compartment of the left knee. Electronically Signed   By: Charlett Nose M.D.   On: 02/28/2016 11:46      Assessment and Plan: 31 y.o. male  with  Left knee pain concerning for medial meniscus injury or anterior cruciate ligament injury. Patient has failed conservative management. Plan for MRI follow-up after MRI.  Smoking: Prescribe Chantix today.  Overweight: Obtain screening fasting labs to assess for cholesterol liver and kidney risk with overweight.   Orders Placed This Encounter  Procedures  . DG Knee 1-2 Views Left    Standing Status:   Future    Number of Occurrences:   1    Standing Expiration Date:   04/30/2017    Order Specific Question:   Reason for Exam (SYMPTOM  OR DIAGNOSIS REQUIRED)    Answer:   For use with right knee x-ray, bilateral AP and Rosenberg standing.    Order Specific Question:   Preferred imaging location?    Answer:   [facility]  . DG Knee Complete 4 Views Left    Please include patellar sunrise, lateral, and weightbearing bilateral AP and bilateral rosenberg views    Standing Status:   Future    Number of Occurrences:   1    Standing Expiration Date:   04/29/2017    Order Specific Question:   Reason for exam:    Answer:   Please include patellar sunrise, lateral, and weightbearing bilateral AP and bilateral rosenberg views    Comments:   Please include patellar sunrise, lateral, and weightbearing bilateral AP and bilateral rosenberg views    Order Specific Question:   Preferred imaging location?    Answer:   [facility]  . MR Knee Left  Wo Contrast    Standing Status:   Future    Standing Expiration Date:   04/27/2017    Order Specific Question:   Reason for Exam (SYMPTOM  OR DIAGNOSIS REQUIRED)    Answer:   Eval knee pain and instability. Suspect ACl vs Med Meniscus    Order Specific Question:   Preferred imaging location?    Answer:   [facility] (table limit-350lbs)    Order Specific Question:   What is the patient's sedation requirement?    Answer:   No Sedation    Order Specific Question:   Does the patient have a pacemaker or implanted devices?     Answer:   No  . CBC  . COMPLETE METABOLIC PANEL WITH GFR  . Lipid panel    Discussed warning signs or symptoms. Please see discharge instructions. Patient expresses understanding.

## 2016-03-17 ENCOUNTER — Encounter (HOSPITAL_COMMUNITY): Payer: Self-pay | Admitting: Emergency Medicine

## 2016-03-17 ENCOUNTER — Emergency Department (HOSPITAL_COMMUNITY)
Admission: EM | Admit: 2016-03-17 | Discharge: 2016-03-17 | Disposition: A | Discharge status: Home / Self Care | Payer: 59 | Attending: Emergency Medicine | Admitting: Emergency Medicine

## 2016-03-17 DIAGNOSIS — Z79899 Other long term (current) drug therapy: Secondary | ICD-10-CM | POA: Insufficient documentation

## 2016-03-17 DIAGNOSIS — S0181XA Laceration without foreign body of other part of head, initial encounter: Secondary | ICD-10-CM | POA: Diagnosis not present

## 2016-03-17 DIAGNOSIS — Y929 Unspecified place or not applicable: Secondary | ICD-10-CM | POA: Diagnosis not present

## 2016-03-17 DIAGNOSIS — W228XXA Striking against or struck by other objects, initial encounter: Secondary | ICD-10-CM | POA: Insufficient documentation

## 2016-03-17 DIAGNOSIS — Y999 Unspecified external cause status: Secondary | ICD-10-CM | POA: Diagnosis not present

## 2016-03-17 DIAGNOSIS — Y939 Activity, unspecified: Secondary | ICD-10-CM | POA: Insufficient documentation

## 2016-03-17 DIAGNOSIS — F17210 Nicotine dependence, cigarettes, uncomplicated: Secondary | ICD-10-CM | POA: Diagnosis not present

## 2016-03-17 MED ORDER — LIDOCAINE HCL (PF) 1 % IJ SOLN
5.0000 mL | Freq: Once | INTRAMUSCULAR | Status: AC
  Administered 2016-03-17: 5 mL
  Filled 2016-03-17: qty 30

## 2016-03-17 MED ORDER — CEPHALEXIN 500 MG PO CAPS: 500.0000 mg | ORAL_CAPSULE | Freq: Four times a day (QID) | ORAL | 0 refills | Status: DC
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
## 2016-03-17 NOTE — ED Notes (Signed)
PA at bedside for procedure.

## 2016-03-17 NOTE — Discharge Instructions (Signed)
Medications: Keflex  Treatment: Take Keflex 4 times daily to help prevent infection. Make sure to finish all of this medication. Keep your wound dry and dressing applied until this time tomorrow. After 24 hours, you may wash with warm soapy water. Dry and apply antibiotic ointment and clean dressing. Do this daily until your sutures are removed.  Follow-up: Please follow-up with your primary care provider or return to emergency department in 7 days for suture removal. Be aware of signs of infection: fever, increasing pain, redness, swelling, drainage from the area. Please call your primary care provider or return to emergency department if you develop any of these symptoms or if any of the sutures come out prior to removal. Please return to the emergency department if you develop any other new or worsening symptoms.

## 2016-03-19 ENCOUNTER — Inpatient Hospital Stay: Payer: PRIVATE HEALTH INSURANCE | Admitting: Family Medicine

## 2016-03-19 NOTE — ED Notes (Signed)
Patient needed work note 

## 2016-03-31 ENCOUNTER — Ambulatory Visit (HOSPITAL_COMMUNITY)
Admission: EM | Admit: 2016-03-31 | Discharge: 2016-03-31 | Disposition: A | Discharge status: Home / Self Care | Payer: PRIVATE HEALTH INSURANCE | Attending: Family Medicine | Admitting: Family Medicine

## 2016-03-31 ENCOUNTER — Encounter (HOSPITAL_COMMUNITY): Payer: Self-pay | Admitting: Emergency Medicine

## 2016-03-31 DIAGNOSIS — Z4802 Encounter for removal of sutures: Secondary | ICD-10-CM

## 2016-03-31 NOTE — Discharge Instructions (Signed)
5 sutures have been removed. Your wound looks well healed without signs of symptoms of infection. Should you develop any signs of infection follow up with your primary care provider or return to clinic as needed.

## 2016-03-31 NOTE — ED Provider Notes (Signed)
CSN: [id number]     Arrival date & time 03/31/16  1201 History   None    Chief Complaint  Patient presents with  . Suture / Staple Removal   (Consider location/radiation/quality/duration/timing/severity/associated sxs/prior Treatment) 31 year old presents for removal of sutures above the right eye. 5 sutures placed in the Er, no complications or signs of infection   The history is provided by the patient.  Suture / Staple Removal  This is a new problem. The current episode started more than 2 days ago. The problem has been rapidly improving. Nothing aggravates the symptoms. The symptoms are relieved by acetaminophen. He has tried acetaminophen for the symptoms. The treatment provided mild relief.    Past Medical History:  Diagnosis Date  . Allergy   . Environmental allergies    History reviewed. No pertinent surgical history. Family History  Problem Relation Age of Onset  . Diabetes Mother   . Hypertension Mother   . Hypertension Father   . Diabetes Father    Social History  Substance Use Topics  . Smoking status: Current Every Day Smoker    Packs/day: 0.75    Years: 10.00    Types: Cigarettes  . Smokeless tobacco: Not on file  . Alcohol use Yes     Comment: occasional    Review of Systems  Reason unable to perform ROS: as covered in HPI.  All other systems reviewed and are negative.   Allergies  Patient has no known allergies.  Home Medications   Prior to Admission medications   Medication Sig Start Date End Date Taking? Authorizing Provider  cephALEXin (KEFLEX) 500 MG capsule Take 1 capsule (500 mg total) by mouth 4 (four) times daily. 03/17/16   Emi Holes, PA-C  diclofenac sodium (VOLTAREN) 1 % GEL Apply 4 g topically 4 (four) times daily. To affected joint. 12/28/15   Rodolph Bong, MD  naproxen (NAPROSYN) 500 MG tablet Take 1 tablet (500 mg total) by mouth 2 (two) times daily as needed for moderate pain. 12/28/15 12/27/16  Rodolph Bong, MD  omeprazole  (PRILOSEC) 40 MG capsule Take 1 capsule (40 mg total) by mouth daily. 12/28/15   Rodolph Bong, MD  varenicline (CHANTIX STARTING MONTH PAK) 0.5 MG X 11 & 1 MG X 42 tablet Take one 0.5mg  tablet by mouth once daily for 3 days, then increase to one 0.5mg  tablet twice daily for 3 days, then increase to one 1mg  tablet twice daily. 02/28/16   Rodolph Bong, MD  varenicline (CHANTIX) 1 MG tablet Take 1 tablet (1 mg total) by mouth 2 (two) times daily. 02/28/16   Rodolph Bong, MD   Meds Ordered and Administered this Visit  Medications - No data to display  BP 131/84 (BP Location: Right Arm)   Pulse 73   Temp 98.2 F (36.8 C) (Oral)   Resp 16   SpO2 100%  No data found.   Physical Exam  Constitutional: He is oriented to person, place, and time. He appears well-developed and well-nourished. No distress.  HENT:  Head: Normocephalic.  Neurological: He is alert and oriented to person, place, and time.  Skin: Skin is warm and dry. Capillary refill takes less than 2 seconds. Laceration (2 cm, well healed laceration, above right eye, 5 sutures inplace ) noted. He is not diaphoretic.  Nursing note and vitals reviewed.   Urgent Care Course     .Suture Removal Date/Time: 03/31/2016 12:47 PM Performed by: Dorena Bodo Authorized by: Milus Glazier  KURT   Consent:    Consent obtained:  Verbal   Consent given by:  Patient   Risks discussed:  Bleeding, pain and wound separation   Alternatives discussed:  No treatment Location:    Location:  Head/neck   Head/neck location:  Eyebrow   Eyebrow location:  R eyebrow Procedure details:    Wound appearance:  No signs of infection, good wound healing, clean, nonpurulent, nontender and pink   Number of sutures removed:  5 Post-procedure details:    Post-removal:  No dressing applied   Patient tolerance of procedure:  Tolerated well, no immediate complications   (including critical care time)  Labs Review Labs Reviewed - No data to display  Imaging  Review No results found.   Visual Acuity Review  Right Eye Distance:   Left Eye Distance:   Bilateral Distance:    Right Eye Near:   Left Eye Near:    Bilateral Near:         MDM   1. Encounter for removal of sutures   5 sutures have been removed. Your wound looks well healed without signs of symptoms of infection. Should you develop any signs of infection follow up with your primary care provider or return to clinic as needed.    Dorena BodoLawrence Daysia Vandenboom, NP 03/31/16 1247

## 2016-03-31 NOTE — ED Triage Notes (Signed)
[facility] a [facility] of needing to have sutures removed from his head above his right eye that were placed on 03/17/2016. The patient has 5 sutures.

## 2016-04-01 ENCOUNTER — Ambulatory Visit (INDEPENDENT_AMBULATORY_CARE_PROVIDER_SITE_OTHER): Payer: PRIVATE HEALTH INSURANCE | Admitting: Family Medicine

## 2016-04-01 VITALS — BP 136/77 | HR 93 | Temp 98.1°F | Wt 184.0 lb

## 2016-04-01 DIAGNOSIS — R197 Diarrhea, unspecified: Secondary | ICD-10-CM

## 2016-04-01 MED ORDER — DIPHENOXYLATE-ATROPINE 2.5-0.025 MG PO TABS: ORAL_TABLET | ORAL | 3 refills | Status: DC

## 2016-04-01 NOTE — Patient Instructions (Signed)
Thank you for coming in today. Get stool labs today.  Take lomotil as needed.    Diarrhea, Adult Diarrhea is frequent loose and watery bowel movements. Diarrhea can make you feel weak and cause you to become dehydrated. Dehydration can make you tired and thirsty, cause you to have a dry mouth, and decrease how often you urinate. Diarrhea typically lasts 2-3 days. However, it can last longer if it is a sign of something more serious. It is important to treat your diarrhea as told by your health care provider. Follow these instructions at home: Eating and drinking Follow these recommendations as told by your health care provider:  Take an oral rehydration solution (ORS). This is a drink that is sold at pharmacies and retail stores.  Drink clear fluids, such as water, ice chips, diluted fruit juice, and low-calorie sports drinks.  Eat bland, easy-to-digest foods in small amounts as you are able. These foods include bananas, applesauce, rice, lean meats, toast, and crackers.  Avoid drinking fluids that contain a lot of sugar or caffeine, such as energy drinks, sports drinks, and soda.  Avoid alcohol.  Avoid spicy or fatty foods. General instructions  Drink enough fluid to keep your urine clear or pale yellow.  Wash your hands often. If soap and water are not available, use hand sanitizer.  Make sure that all people in your household wash their hands well and often.  Take over-the-counter and prescription medicines only as told by your health care provider.  Rest at home while you recover.  Watch your condition for any changes.  Take a warm bath to relieve any burning or pain from frequent diarrhea episodes.  Keep all follow-up visits as told by your health care provider. This is important. Contact a health care provider if:  You have a fever.  Your diarrhea gets worse.  You have new symptoms.  You cannot keep fluids down.  You feel light-headed or dizzy.  You have a  headache  You have muscle cramps. Get help right away if:  You have chest pain.  You feel extremely weak or you faint.  You have bloody or black stools or stools that look like tar.  You have severe pain, cramping, or bloating in your abdomen.  You have trouble breathing or you are breathing very quickly.  Your heart is beating very quickly.  Your skin feels cold and clammy.  You feel confused.  You have signs of dehydration, such as:  Dark urine, very little urine, or no urine.  Cracked lips.  Dry mouth.  Sunken eyes.  Sleepiness.  Weakness. This information is not intended to replace advice given to you by your health care provider. Make sure you discuss any questions you have with your health care provider. Document Released: 02/01/2002 Document Revised: 06/22/2015 Document Reviewed: 10/18/2014 Elsevier Interactive Patient Education  2017 [employer]

## 2016-04-01 NOTE — Progress Notes (Signed)
Dan ConesRobert L Brady is a 31 y.o. male who [name] today for diarrhea. Patient has had abdominal cramping associated with diarrhea for the last 3 days. He's tried over-the-counter medications including Alka-Seltzer Pepto-Bismol which have not helped much. He has not tried Imodium yet. He denies any vomiting fevers or chills. He notes that he suffered a facial laceration a few weeks ago and took Keflex antibiotics about one week ago.   Past Medical History:  Diagnosis Date  . Allergy   . Environmental allergies    No past surgical history on file. Social History  Substance Use Topics  . Smoking status: Current Every Day Smoker    Packs/day: 0.75    Years: 10.00    Types: Cigarettes  . Smokeless tobacco: Not on file  . Alcohol use Yes     Comment: occasional   family history includes Diabetes in his father and mother; Hypertension in his father and mother.  ROS as above:  Medications: Current Outpatient Prescriptions  Medication Sig Dispense Refill  . diclofenac sodium (VOLTAREN) 1 % GEL Apply 4 g topically 4 (four) times daily. To affected joint. 100 g 11  . naproxen (NAPROSYN) 500 MG tablet Take 1 tablet (500 mg total) by mouth 2 (two) times daily as needed for moderate pain. 30 tablet 0  . omeprazole (PRILOSEC) 40 MG capsule Take 1 capsule (40 mg total) by mouth daily. 30 capsule 1  . varenicline (CHANTIX STARTING MONTH PAK) 0.5 MG X 11 & 1 MG X 42 tablet Take one 0.5mg  tablet by mouth once daily for 3 days, then increase to one 0.5mg  tablet twice daily for 3 days, then increase to one 1mg  tablet twice daily. 53 tablet 0  . varenicline (CHANTIX) 1 MG tablet Take 1 tablet (1 mg total) by mouth 2 (two) times daily. 60 tablet 3  . diphenoxylate-atropine (LOMOTIL) 2.5-0.025 MG tablet One to 2 tablets by mouth 4 times a day as needed for diarrhea. 30  tablet 3   No current facility-administered medications for this visit.    No Known Allergies  Health Maintenance Health Maintenance  Topic Date Due  . INFLUENZA VACCINE  10/26/2017 (Originally 09/26/2015)  . TETANUS/TDAP  09/28/2023  . HIV Screening  Completed     Exam:  BP 136/77   Pulse 93   Temp 98.1 F (36.7 C) (Oral)   Wt 184 lb (83.5 kg)   SpO2 97%   BMI 26.40 kg/m  Gen: Well NAD Nontoxic appearing HEENT: EOMI,  MMM Lungs: Normal work of breathing. CTABL Heart: RRR no MRG Abd: NABS, Soft. Nondistended, Nontender Exts: Brisk capillary refill, warm and well perfused.    No results found for this or any previous visit (from the past 72 hour(s)). No results found.    Assessment and Plan: 31 y.o. male with diarrhea. Likely viral but 3 days is getting a bit long. Patient has been recently exposed to Keflex antibiotics. Plan to check stool culture and C. difficile test. Treat with Lomotil empirically. Work note provided recheck as needed.   Orders Placed This Encounter  Procedures  . Stool culture  . C. difficile GDH and Toxin A/B   Meds ordered this encounter  Medications  . diphenoxylate-atropine (LOMOTIL) 2.5-0.025 MG tablet    Sig: One to 2 tablets by mouth 4 times a day as needed for diarrhea.    Dispense:  30 tablet    Refill:  3  Discussed warning signs or symptoms. Please see discharge instructions. Patient expresses understanding.

## 2016-04-09 ENCOUNTER — Emergency Department (HOSPITAL_COMMUNITY)
Admission: EM | Admit: 2016-04-09 | Discharge: 2016-04-09 | Disposition: A | Discharge status: Home / Self Care | Payer: PRIVATE HEALTH INSURANCE | Attending: Emergency Medicine | Admitting: Emergency Medicine

## 2016-04-09 ENCOUNTER — Encounter (HOSPITAL_COMMUNITY): Payer: Self-pay

## 2016-04-09 DIAGNOSIS — F17210 Nicotine dependence, cigarettes, uncomplicated: Secondary | ICD-10-CM | POA: Diagnosis not present

## 2016-04-09 DIAGNOSIS — K529 Noninfective gastroenteritis and colitis, unspecified: Secondary | ICD-10-CM | POA: Diagnosis not present

## 2016-04-09 DIAGNOSIS — R112 Nausea with vomiting, unspecified: Secondary | ICD-10-CM

## 2016-04-09 DIAGNOSIS — R197 Diarrhea, unspecified: Secondary | ICD-10-CM

## 2016-04-09 MED ORDER — ONDANSETRON HCL 4 MG PO TABS: 4.0000 mg | ORAL_TABLET | Freq: Four times a day (QID) | ORAL | 0 refills | Status: DC

## 2016-04-09 MED ORDER — DIPHENOXYLATE-ATROPINE 2.5-0.025 MG PO TABS: 2.0000 | ORAL_TABLET | Freq: Four times a day (QID) | ORAL | 0 refills | Status: DC | PRN

## 2016-04-09 NOTE — ED Provider Notes (Signed)
[facility]-EMERGENCY DEPT Provider Note   CSN: [id number] Arrival date & time: 04/09/16  0344     History   Chief Complaint Chief Complaint  Patient presents with  . Abdominal Pain    HPI Dan Brady is a 31 y.o. male.  Patient presents to the ER for 2 or 3 days of nausea, vomiting and diarrhea. Patient reports that his kids had the same symptoms last week. He is starting to improve today. He has been able to eat some crackers and drink fluids without any further vomiting. He has not had any diarrhea for approximately 2 hours. He does not think he can make it to work tonight, however, came in to be checked and possibly a work note.      Past Medical History:  Diagnosis Date  . Allergy   . Environmental allergies     Patient Active Problem List   Diagnosis Date Noted  . Smoking 02/28/2016  . Overweight 02/28/2016  . Left knee pain 12/28/2015    History reviewed. No pertinent surgical history.     Home Medications    Prior to Admission medications   Medication Sig Start Date End Date Taking? Authorizing Provider  diclofenac sodium (VOLTAREN) 1 % GEL Apply 4 g topically 4 (four) times daily. To affected joint. 12/28/15   Rodolph BongEvan S Corey, MD  diphenoxylate-atropine (LOMOTIL) 2.5-0.025 MG tablet One to 2 tablets by mouth 4 times a day as needed for diarrhea. 04/01/16   Rodolph BongEvan S Corey, MD  diphenoxylate-atropine (LOMOTIL) 2.5-0.025 MG tablet Take 2 tablets by mouth 4 (four) times daily as needed for diarrhea or loose stools. 04/09/16   Gilda Creasehristopher J Pollina, MD  naproxen (NAPROSYN) 500 MG tablet Take 1 tablet (500 mg total) by mouth 2 (two) times daily as needed for moderate pain. 12/28/15 12/27/16  Rodolph BongEvan S Corey, MD  omeprazole (PRILOSEC) 40 MG capsule Take 1 capsule (40 mg total) by mouth daily. 12/28/15   Rodolph BongEvan S Corey, MD  ondansetron (ZOFRAN) 4 MG tablet Take 1 tablet (4 mg total) by mouth every 6 (six) hours. 04/09/16   Gilda Creasehristopher J Pollina, MD  varenicline (CHANTIX STARTING  MONTH PAK) 0.5 MG X 11 & 1 MG X 42 tablet Take one 0.5mg  tablet by mouth once daily for 3 days, then increase to one 0.5mg  tablet twice daily for 3 days, then increase to one 1mg  tablet twice daily. 02/28/16   Rodolph BongEvan S Corey, MD  varenicline (CHANTIX) 1 MG tablet Take 1 tablet (1 mg total) by mouth 2 (two) times daily. 02/28/16   Rodolph BongEvan S Corey, MD    Family History Family History  Problem Relation Age of Onset  . Diabetes Mother   . Hypertension Mother   . Hypertension Father   . Diabetes Father     Social History Social History  Substance Use Topics  . Smoking status: Current Every Day Smoker    Packs/day: 0.75    Years: 10.00    Types: Cigarettes  . Smokeless tobacco: Never Used  . Alcohol use Yes     Comment: occasional     Allergies   Patient has no known allergies.   Review of Systems Review of Systems  Gastrointestinal: Positive for diarrhea, nausea and vomiting.  All other systems reviewed and are negative.    Physical Exam Updated Vital Signs BP 156/88 (BP Location: Left Arm)   Pulse 78   Temp 97.8 F (36.6 C) (Oral)   Resp 20   SpO2 100%   Physical Exam  Constitutional: He is oriented to person, place, and time. He appears well-developed and well-nourished. No distress.  HENT:  Head: Normocephalic and atraumatic.  Right Ear: Hearing normal.  Left Ear: Hearing normal.  Nose: Nose normal.  Mouth/Throat: Oropharynx is clear and moist and mucous membranes are normal.  Eyes: Conjunctivae and EOM are normal. Pupils are equal, round, and reactive to light.  Neck: Normal range of motion. Neck supple.  Cardiovascular: Regular rhythm, S1 normal and S2 normal.  Exam reveals no gallop and no friction rub.   No murmur heard. Pulmonary/Chest: Effort normal and breath sounds normal. No respiratory distress. He exhibits no tenderness.  Abdominal: Soft. Normal appearance and bowel sounds are normal. There is no hepatosplenomegaly. There is no tenderness. There is no  rebound, no guarding, no tenderness at McBurney's point and negative Murphy's sign. No hernia.  Musculoskeletal: Normal range of motion.  Neurological: He is alert and oriented to person, place, and time. He has normal strength. No cranial nerve deficit or sensory deficit. Coordination normal. GCS eye subscore is 4. GCS verbal subscore is 5. GCS motor subscore is 6.  Skin: Skin is warm, dry and intact. No rash noted. No cyanosis.  Psychiatric: He has a normal mood and affect. His speech is normal and behavior is normal. Thought content normal.  Nursing note and vitals reviewed.    ED Treatments / Results  Labs (all labs ordered are listed, but only abnormal results are displayed) Labs Reviewed - No data to display  EKG  EKG Interpretation None       Radiology No results found.  Procedures Procedures (including critical care time)  Medications Ordered in ED Medications - No data to display   Initial Impression / Assessment and Plan / ED Course  I have reviewed the triage vital signs and the nursing notes.  Pertinent labs & imaging results that were available during my care of the patient were reviewed by me and considered in my medical decision making (see chart for details).     Presents to the ER with nausea, vomiting and diarrhea. He has positive sick contacts with similar symptoms at home. Abdominal exam is benign, nontender. No concern for acute surgical process. He appears clinically hydrated. Patient will be treated symptomatically, given a work note.  Final Clinical Impressions(s) / ED Diagnoses   Final diagnoses:  Nausea vomiting and diarrhea  Gastroenteritis    New Prescriptions New Prescriptions   DIPHENOXYLATE-ATROPINE (LOMOTIL) 2.5-0.025 MG TABLET    Take 2 tablets by mouth 4 (four) times daily as needed for diarrhea or loose stools.   ONDANSETRON (ZOFRAN) 4 MG TABLET    Take 1 tablet (4 mg total) by mouth every 6 (six) hours.     Gilda Crease, MD 04/09/16 [phone]

## 2016-04-09 NOTE — ED Triage Notes (Signed)
Pt complains of vomiting and diarrhea since Sunday morning, he's unable to keep anything on his stomach

## 2016-04-22 ENCOUNTER — Telehealth: Payer: Self-pay | Admitting: Family Medicine

## 2016-04-22 ENCOUNTER — Encounter: Payer: Self-pay | Admitting: Family Medicine

## 2016-04-22 ENCOUNTER — Ambulatory Visit (INDEPENDENT_AMBULATORY_CARE_PROVIDER_SITE_OTHER): Payer: PRIVATE HEALTH INSURANCE

## 2016-04-22 DIAGNOSIS — M25562 Pain in left knee: Secondary | ICD-10-CM

## 2016-04-22 DIAGNOSIS — X58XXXD Exposure to other specified factors, subsequent encounter: Secondary | ICD-10-CM

## 2016-04-22 DIAGNOSIS — S83015D Lateral dislocation of left patella, subsequent encounter: Secondary | ICD-10-CM | POA: Diagnosis not present

## 2016-04-22 DIAGNOSIS — M25462 Effusion, left knee: Secondary | ICD-10-CM

## 2016-04-22 NOTE — Telephone Encounter (Signed)
Letter written. Pt notified. 

## 2016-04-22 NOTE — Telephone Encounter (Signed)
OK to do work note.  Dan Brady

## 2016-04-22 NOTE — Telephone Encounter (Signed)
Pt request work note for 2/22, 2/25, and 2/26. Pt was out of work due to increased pain, did get MRI today. Will route.

## 2016-04-29 ENCOUNTER — Ambulatory Visit (INDEPENDENT_AMBULATORY_CARE_PROVIDER_SITE_OTHER): Payer: PRIVATE HEALTH INSURANCE | Admitting: Family Medicine

## 2016-04-29 VITALS — BP 125/77 | HR 69

## 2016-04-29 DIAGNOSIS — M2242 Chondromalacia patellae, left knee: Secondary | ICD-10-CM | POA: Diagnosis not present

## 2016-04-29 NOTE — Progress Notes (Signed)
Dan ConesRobert L Brady is a 31 y.o. male who [facility] today for follow-up left knee pain and MRI. Patient notes left anterior knee pain worse with prolonged sitting and climbing stairs. He denies any radiating pain weakness or numbness.   Past Medical History:  Diagnosis Date  . Allergy   . Environmental allergies    No past surgical history on file. Social History  Substance Use Topics  . Smoking status: Current Every Day Smoker    Packs/day: 0.75    Years: 10.00    Types: Cigarettes  . Smokeless tobacco: Never Used  . Alcohol use Yes     Comment: occasional     ROS:  As above   Medications: Current Outpatient Prescriptions  Medication Sig Dispense Refill  . diclofenac sodium (VOLTAREN) 1 % GEL Apply 4 g topically 4 (four) times daily. To affected joint. 100 g 11  . diphenoxylate-atropine (LOMOTIL) 2.5-0.025 MG tablet One to 2 tablets by mouth 4 times a day as needed for diarrhea. 30 tablet 3  . diphenoxylate-atropine (LOMOTIL) 2.5-0.025 MG tablet Take 2 tablets by mouth 4 (four) times daily as needed for diarrhea or loose stools. 20 tablet 0  . naproxen (NAPROSYN) 500 MG tablet Take 1 tablet (500 mg total) by mouth 2 (two) times daily as needed for moderate pain. 30 tablet 0  . omeprazole (PRILOSEC) 40 MG capsule Take 1 capsule (40 mg total) by mouth daily. 30 capsule 1  . ondansetron (ZOFRAN) 4 MG tablet Take 1 tablet (4 mg total) by mouth every 6 (six) hours. 12 tablet 0  . varenicline (CHANTIX STARTING MONTH PAK) 0.5 MG X 11 & 1 MG X 42 tablet Take one 0.5mg  tablet by mouth once daily for 3 days, then increase to one 0.5mg  tablet twice daily for 3 days, then increase to one 1mg  tablet twice daily. 53 tablet 0  . varenicline (CHANTIX) 1 MG tablet Take 1 tablet (1 mg total) by mouth 2 (two) times daily. 60 tablet 3   No current facility-administered medications for this visit.    No Known Allergies   Exam:  BP  125/77   Pulse 69  General: Well Developed, well nourished, and in no acute distress.  Neuro/Psych: Alert and oriented x3, extra-ocular muscles intact, able to move all 4 extremities, sensation grossly intact. Skin: Warm and dry, no rashes noted.  Respiratory: Not using accessory muscles, speaking in full sentences, trachea midline.  Cardiovascular: Pulses palpable, no extremity edema. Abdomen: Does not appear distended. MSK: Left knee: Normal-appearing with decreased VMO bulk no effusion. Nontender. 1+ retropatellar crepitations on extension. Intact extension and flexion strength.  Left hip Abductor weakness present  Study Result   CLINICAL DATA:  Left knee pain since October 2017 with locking, catching, and popping.  EXAM: MRI OF THE LEFT KNEE WITHOUT CONTRAST  TECHNIQUE: Multiplanar, multisequence MR imaging of the knee was performed. No intravenous contrast was administered.  COMPARISON:  02/28/2016 radiographs  FINDINGS: MENISCI  Medial meniscus:  Unremarkable  Lateral meniscus: Slightly thickened inferior popliteomeniscal fascicle but no overt tear seen.  LIGAMENTS  Cruciates:  Unremarkable  Collaterals:  Unremarkable  CARTILAGE  Patellofemoral: Mild chondral irregularity and minimal chondral thinning laterally along the femoral trochlear groove as on images 13-14 series 3.  Medial:  Unremarkable  Lateral:  Unremarkable  Joint:  Trace knee joint effusion.  Popliteal Fossa:  Unremarkable  Extensor Mechanism: Shallow femoral trochlear groove with mild lateral patellar displacement in the trochlear  groove. Tibial tubercle -trochlear groove distance 1.6 cm.  Bones: No significant extra-articular osseous abnormalities identified.  Other: No supplemental non-categorized findings.  IMPRESSION: 1. Shallow femoral trochlear groove with mild lateral displacement and lateral tilt of the patella, along with some mild surface irregularity  and thinning of the articular cartilage in the lateral femoral trochlear groove. Tibial tubercle -trochlear groove distance 1.6 cm. 2. Trace knee joint effusion. 3. Slightly thickened inferior popliteomeniscal fascicle but without overt tear seen.   Electronically Signed   By: [name] M.D.   On: 04/22/2016 10:43        Assessment and Plan: 31 y.o. 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 This is made worse by decreased VMO bulk and hip abductor weakness. Plan to treat as patellofemoral pain syndrome. Work on [employer] and hip abductor strengthening. Avoid exacerbating activities. Recheck in a month or so.    No orders of the defined types were placed in this encounter.   Discussed warning signs or symptoms. Please see discharge instructions. Patient expresses understanding.

## 2016-04-29 NOTE — Patient Instructions (Signed)
Thank you for coming in today. You have weakness of the VMO and Hip Abductor muscles.  Do the side leg raises 2-3 sets of 30 reps each side.  Do toe out straight leg raises 2-3 sets 30 reps each side.   Do exercise bike intervals as well.   Avoid lunges and squats.    Recheck in 1 month.   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 .      Patellofemoral Pain Syndrome Patellofemoral pain syndrome is a condition that involves a softening or breakdown of the tissue (cartilage) on the underside of your kneecap (patella). This causes pain in the front of the knee. The condition is also called runner's knee or chondromalacia patella. Patellofemoral pain syndrome is most common in young adults who are active in sports. Your knee is the largest joint in your body. The patella covers the front of your knee and is attached to muscles above and below your knee. The underside of the patella is covered with a smooth type of cartilage (synovium). The smooth surface helps the patella glide easily when you move your knee. Patellofemoral pain syndrome causes swelling in the joint linings and bone surfaces in your knee. What are the causes? Patellofemoral pain syndrome can be caused by:  Overuse.  Poor alignment of your knee joints.  Weak leg muscles.  A direct blow to your kneecap. What increases the risk? You may be at risk for patellofemoral pain syndrome if you:  Do a lot of activities that can wear down your kneecap. These include:  Running.  Squatting.  Climbing stairs.  Start a new physical activity or exercise program.  Wear shoes that do not fit well.  Do not have good leg strength.  Are overweight. What are the signs or symptoms? Knee pain is the most common symptom of patellofemoral pain syndrome. This may feel like a dull, aching pain underneath your patella, in the front of your knee. There may be a popping or cracking sound when you move your knee. Pain may get worse  with:  Exercise.  Climbing stairs.  Running.  Jumping.  Squatting.  Kneeling.  Sitting for a long time.  Moving or pushing on your patella. How is this diagnosed? Your health care provider may be able to diagnose patellofemoral pain syndrome from your symptoms and medical history. You may be asked about your recent physical activities and which ones cause knee pain. Your health care provider may do a physical exam with certain tests to confirm the diagnosis. These may include:  Moving your patella back and forth.  Checking your range of knee motion.  Having you squat or jump to see if you have pain.  Checking the strength of your leg muscles. An MRI of the knee may also be done. How is this treated? Patellofemoral pain syndrome can usually be treated at home with rest, ice, compression, and elevation (RICE). Other treatments may include:  Nonsteroidal anti-inflammatory drugs (NSAIDs).  Physical therapy to stretch and strengthen your leg muscles.  Shoe inserts (orthotics) to take stress off your knee.  A knee brace or knee support.  Surgery to remove damaged cartilage or move the patella to a better position. The need for surgery is rare. Follow these instructions at home:  Take medicines only as directed by your health care provider.  Rest your knee.  When resting, keep your knee raised above the level of your heart.  Avoid activities that cause knee pain.  Apply ice to the injured area:  Put  ice in a plastic bag.  Place a towel between your skin and the bag.  Leave the ice on for 20 minutes, 2-3 times a day.  Use splints, braces, knee supports, or walking aids as directed by your health care provider.  Perform stretching and strengthening exercises as directed by your health care provider or physical therapist.  Keep all follow-up visits as directed by your health care provider. This is important. Contact a health care provider if:  Your symptoms get  worse.  You are not improving with home care. This information is not intended to replace advice given to you by your health care provider. Make sure you discuss any questions you have with your health care provider. Document Released: 01/30/2009 Document Revised: 07/20/2015 Document Reviewed: 05/03/2013 Elsevier Interactive Patient Education  2017 [employer].

## 2016-06-04 ENCOUNTER — Ambulatory Visit: Payer: PRIVATE HEALTH INSURANCE | Admitting: Family Medicine

## 2016-06-24 ENCOUNTER — Ambulatory Visit: Payer: PRIVATE HEALTH INSURANCE | Admitting: Family Medicine

## 2016-06-28 ENCOUNTER — Ambulatory Visit (INDEPENDENT_AMBULATORY_CARE_PROVIDER_SITE_OTHER): Payer: PRIVATE HEALTH INSURANCE | Admitting: Family Medicine

## 2016-06-28 ENCOUNTER — Encounter: Payer: Self-pay | Admitting: Family Medicine

## 2016-06-28 ENCOUNTER — Ambulatory Visit (INDEPENDENT_AMBULATORY_CARE_PROVIDER_SITE_OTHER): Payer: PRIVATE HEALTH INSURANCE

## 2016-06-28 DIAGNOSIS — M25512 Pain in left shoulder: Secondary | ICD-10-CM

## 2016-06-28 NOTE — Patient Instructions (Signed)
Thank you for coming in today. Work on the shoulder exercises.  Up to the front and side and external and internal rotation.  Do this 2-3x daily.  Also do this as a warm up prior to weight lifting.  When lifting make sure to keep your hands in your peripheral vision.  Recheck in 1 month if not better.    Secondary Shoulder Impingement Syndrome Rehab Ask your health care provider which exercises are safe for you. Do exercises exactly as told by your health care provider and adjust them as directed. It is normal to feel mild stretching, pulling, tightness, or discomfort as you do these exercises, but you should stop right away if you feel sudden pain or your pain gets worse. Do not begin these exercises until told by your health care provider. Stretching and range of motion exercise This exercise warms up your muscles and joints and improves the movement and flexibility of your neck and shoulder. This exercise also helps to relieve pain and stiffness. Exercise A: Cervical side bend   1. Using good posture, sit on a stable chair, or stand up. 2. Without moving your shoulders, slowly tilt your left / right ear toward your left / right shoulder until you feel a stretch in your neck muscles. You should be looking straight ahead. 3. Hold for __________ seconds. 4. Slowly return to the starting position. 5. Repeat on your left / right side. Repeat __________ times. Complete this exercise __________ times a day. Strengthening exercises These exercises build strength and endurance in your shoulder. Endurance is the ability to use your muscles for a long time, even after they get tired. Exercise B: Scapular protraction, supine   1. Lie on your back on a firm surface. Hold a __________ weight in your left / right hand. 2. Raise your left / right arm straight into the air so your hand is directly above your shoulder joint. 3. Push the weight into the air so your shoulder lifts off of the surface that  you are lying on. Do not move your head, neck, or back. 4. Hold for __________ seconds. 5. Slowly return to the starting position. Let your muscles relax completely before you repeat this exercise. Repeat __________ times. Complete this exercise __________ times a day. Exercise C: Scapular retraction   1. Sit in a stable chair without armrests, or stand. 2. Secure an exercise band to a stable object in front of you so the band is at shoulder height. 3. Hold one end of the exercise band in each hand. Your palms should face down. 4. Squeeze your shoulder blades together and move your elbows slightly behind you. Do not shrug your shoulders while you do this. 5. Hold for __________ seconds. 6. Slowly return to the starting position. Repeat __________ times. Complete this exercise __________ times a day. Exercise D: Shoulder extension with scapular retraction   1. Sit in a stable chair without armrests, or stand. 2. Secure an exercise band to a stable object in front of you where it is above shoulder height. 3. Hold one end of the exercise band in each hand. 4. Straighten your elbows and lift your hands up to shoulder height. 5. Squeeze your shoulder blades together and pull your hands down to the sides of your thighs. Stop when your hands are straight down by your sides. Do not let your hands go behind your body. 6. Hold for __________ seconds. 7. Slowly return to the starting position. Repeat __________ times. Complete this  exercise __________ times a day. Exercise E: Shoulder abduction  1. Sit in a stable chair without armrests, or stand. 2. If directed, hold a __________ weight in your left / right hand. 3. Start with your arms straight down. Turn your left / right hand so your palm faces in, toward your body. 4. Slowly lift your left / right hand out to your side. Do not lift your hand above shoulder height.  Keep your arms straight.  Avoid shrugging your shoulder while you do this  movement. Keep your shoulder blade tucked down toward the middle of your back. 5. Hold for __________ seconds. 6. Slowly lower your arm, and return to the starting position. Repeat __________ times. Complete this exercise __________ times a day. This information is not intended to replace advice given to you by your health care provider. Make sure you discuss any questions you have with your health care provider. Document Released: 02/11/2005 Document Revised: 10/19/2015 Document Reviewed: 01/14/2015 Elsevier Interactive Patient Education  2017 [employer]

## 2016-06-28 NOTE — Progress Notes (Signed)
Dan Brady is a 31 y.o. male who presents to [facility] today for shoulder pain. Patient notes a ongoing history of left shoulder pain. He's had previous steroid injections in the past which have helped a little. He has pain at the superior aspect of his shoulder and at the lateral upper arm. The pain is worsened a bit in the last 3 weeks is been lifting more. He notes pain worse with overhead motion and with weight lifting. He denies any radiating pain weakness or numbness.   Past Medical History:  Diagnosis Date  . Allergy   . Environmental allergies    No past surgical history on file. Social History  Substance Use Topics  . Smoking status: Current Every Day Smoker    Packs/day: 0.75    Years: 10.00    Types: Cigarettes  . Smokeless tobacco: Never Used  . Alcohol use Yes     Comment: occasional     ROS:  As above   Medications: Current Outpatient Prescriptions  Medication Sig Dispense Refill  . varenicline (CHANTIX) 1 MG tablet Take 1 tablet (1 mg total) by mouth 2 (two) times daily. 60 tablet 3   No current facility-administered medications for this visit.    No Known Allergies   Exam:  BP (!) 146/86   Pulse 87   Wt 189 lb (85.7 kg)   BMI 27.12 kg/m  General: Well Developed, well nourished, and in no acute distress.  Neuro/Psych: Alert and oriented x3, extra-ocular muscles intact, able to move all 4 extremities, sensation grossly intact. Skin: Warm and dry, no rashes noted.  Respiratory: Not using accessory muscles, speaking in full sentences, trachea midline.  Cardiovascular: Pulses palpable, no extremity edema. Abdomen: Does not appear distended. MSK: Left shoulder normal-appearing. Mildly tender palpation overlying the [place] clavicular joint. Positive crossover arm compression test. Normal motion. Positive empty can test. Positive Hawkins and Neer's test. Strength is intact throughout.    No results  found for this or any previous visit (from the past 48 hour(s)). Dg Clavicle Left  Result Date: 06/28/2016 CLINICAL DATA:  Left shoulder pain for 1 month. EXAM: LEFT CLAVICLE - 2+ VIEWS COMPARISON:  None. FINDINGS: There is no evidence of fracture or other focal bone lesions. Soft tissues are unremarkable. IMPRESSION: Normal left clavicle. Electronically Signed   By: Lupita Raider, M.D.   On: 06/28/2016 12:44   Dg Shoulder Left  Result Date: 06/28/2016 CLINICAL DATA:  Left shoulder pain for 1 month without known injury. EXAM: LEFT SHOULDER - 2+ VIEW COMPARISON:  None. FINDINGS: There is no evidence of fracture or dislocation. There is no evidence of arthropathy or other focal bone abnormality. Soft tissues are unremarkable. IMPRESSION: Normal left shoulder. Electronically Signed   By: Lupita Raider, M.D.   On: 06/28/2016 12:46      Assessment and Plan: 31 y.o. male with shoulder pain. This is probable multifactorial. He does not have radiographic evidence for distal clavicle ostial lysis as I was suspicious for. He does have some pain with compression and impingement testing. We discussed options and plan for a trial of home physical therapy. If not better will proceed with injections and further workup. Recheck in about a month. Shoulder rotation range of motion exercises demonstrated and reviewed.    Orders Placed This Encounter  Procedures  . DG Clavicle Left    Order Specific Question:   Reason for exam:    Answer:   Assess fracture,  please include serendipity view.    Order Specific Question:   Preferred imaging location?    Answer:   [facility]  . DG Shoulder Left    Standing Status:   Future    Number of Occurrences:   1    Standing Expiration Date:   08/28/2017    Order Specific Question:   Reason for Exam (SYMPTOM  OR DIAGNOSIS REQUIRED)    Answer:   eval shoulder pain ? ac    Order Specific Question:   Preferred imaging location?    Answer:   [facility]  [place]    Order Specific Question:   Radiology Contrast Protocol - do NOT remove file path    Answer:   [email or web address]    Discussed warning signs or symptoms. Please see discharge instructions. Patient expresses understanding.

## 2016-07-01 ENCOUNTER — Telehealth: Payer: Self-pay

## 2016-07-01 NOTE — Telephone Encounter (Signed)
Pt is requesting a work note for his shoulder injury, returning today to full duty.  He would like to pick it up today around 2 if possible.  Please advise.

## 2016-07-02 ENCOUNTER — Encounter: Payer: Self-pay | Admitting: Family Medicine

## 2016-07-02 NOTE — Telephone Encounter (Signed)
Letter ready for pick up

## 2016-07-02 NOTE — Telephone Encounter (Signed)
Left update on Pt's VM.  

## 2016-10-02 ENCOUNTER — Encounter: Payer: Self-pay | Admitting: Family Medicine

## 2016-10-02 ENCOUNTER — Ambulatory Visit (INDEPENDENT_AMBULATORY_CARE_PROVIDER_SITE_OTHER): Payer: PRIVATE HEALTH INSURANCE | Admitting: Family Medicine

## 2016-10-02 VITALS — BP 135/87 | HR 99 | Temp 98.0°F | Ht 69.0 in | Wt 182.0 lb

## 2016-10-02 DIAGNOSIS — A084 Viral intestinal infection, unspecified: Secondary | ICD-10-CM | POA: Diagnosis not present

## 2016-10-02 DIAGNOSIS — F17200 Nicotine dependence, unspecified, uncomplicated: Secondary | ICD-10-CM

## 2016-10-02 MED ORDER — VARENICLINE TARTRATE 0.5 MG X 11 & 1 MG X 42 PO MISC: ORAL | 0 refills | Status: DC

## 2016-10-02 NOTE — Progress Notes (Signed)
       Dan ConesRobert L Brady is a 31 y.o. male who [name] today for abdominal pain. Patient developed abdominal cramping and diarrhea on August 5. He's feeling much better now and is ready to return to work. He denies any fevers or vomiting or blood in the stool. He needs a note to return to work tomorrow.  Smoking: Patient currently smokes cigarettes and would like to quit. He is interested in Chantix prescription.   Past Medical History:  Diagnosis Date  . Allergy   . Environmental allergies    No past surgical history on file. Social History  Substance Use Topics  . Smoking status: Current Every Day Smoker    Packs/day: 0.75    Years: 10.00    Types: Cigarettes  . Smokeless tobacco: Never Used  . Alcohol use Yes     Comment: occasional   family history includes Diabetes in his father and mother; Hypertension in his father and mother.  ROS as above:  Medications: Current Outpatient Prescriptions  Medication Sig Dispense Refill  . varenicline (CHANTIX STARTING MONTH PAK) 0.5 MG X 11 & 1 MG X 42 tablet Take one 0.5mg  tablet by mouth once daily for 3 days, then increase to one 0.5mg  tablet twice daily for 3 days, then increase to one 1mg  tablet twice daily. 53 tablet 0  . varenicline (CHANTIX) 1 MG tablet Take 1 tablet (1 mg total) by mouth 2 (two) times daily. 60 tablet 3   No current facility-administered medications for this visit.    No Known Allergies  Health Maintenance Health Maintenance  Topic Date Due  . INFLUENZA VACCINE  10/26/2017 (Originally 09/25/2016)  . TETANUS/TDAP  09/28/2023  . HIV Screening  Completed     Exam:  BP 135/87   Pulse 99   Temp 98 F (36.7 C) (Oral)   Ht 5\' 9"  (1.753 m)   Wt 182 lb (82.6 kg)   BMI 26.88 kg/m  Gen: Well NAD HEENT: EOMI,  MMM Lungs: Normal work of breathing. CTABL Heart: RRR no MRG Abd:  NABS, Soft. Nondistended, Nontender Exts: Brisk capillary refill, warm and well perfused.    No results found for this or any previous visit (from the past 72 hour(s)). No results found.    Assessment and Plan: 31 y.o. male with  Abdominal pain: Likely resolve viral gastroenteritis. Work note provided return as needed.  Smoking: We'll start Chantix for 3 months. Return as needed.   No orders of the defined types were placed in this encounter.  Meds ordered this encounter  Medications  . varenicline (CHANTIX STARTING MONTH PAK) 0.5 MG X 11 & 1 MG X 42 tablet    Sig: Take one 0.5mg  tablet by mouth once daily for 3 days, then increase to one 0.5mg  tablet twice daily for 3 days, then increase to one 1mg  tablet twice daily.    Dispense:  53 tablet    Refill:  0     Discussed warning signs or symptoms. Please see discharge instructions. Patient expresses understanding.

## 2016-10-02 NOTE — Patient Instructions (Signed)
Thank you for coming in today.  Start Chantix.  Let me know when you need a refill.  Take chantix for 3 months.   Varenicline oral tablets What is this medicine? VARENICLINE (var EN i kleen) is used to help people quit smoking. It can reduce the symptoms caused by stopping smoking. It is used with a patient support program recommended by your physician. This medicine may be used for other purposes; ask your health care provider or pharmacist if you have questions. COMMON BRAND NAME(S): Chantix What should I tell my health care provider before I take this medicine? They need to know if you have any of these conditions: -bipolar disorder, depression, schizophrenia or other mental illness -heart disease -if you often drink alcohol -kidney disease -peripheral vascular disease -seizures -stroke -suicidal thoughts, plans, or attempt; a previous suicide attempt by you or a family member -an unusual or allergic reaction to varenicline, other medicines, foods, dyes, or preservatives -pregnant or trying to get pregnant -breast-feeding How should I use this medicine? Take this medicine by mouth after eating. Take with a full glass of water. Follow the directions on the prescription label. Take your doses at regular intervals. Do not take your medicine more often than directed. There are 3 ways you can use this medicine to help you quit smoking; talk to your health care professional to decide which plan is right for you: 1) you can choose a quit date and start this medicine 1 week before the quit date, or, 2) you can start taking this medicine before you choose a quit date, and then pick a quit date between day 8 and 35 days of treatment, or, 3) if you are not sure that you are able or willing to quit smoking right away, start taking this medicine and slowly decrease the amount you smoke as directed by your health care professional with the goal of being cigarette-free by week 12 of  treatment. Stick to your plan; ask about support groups or other ways to help you remain cigarette-free. If you are motivated to quit smoking and did not succeed during a previous attempt with this medicine for reasons other than side effects, or if you returned to smoking after this treatment, speak with your health care professional about whether another course of this medicine may be right for you. A special MedGuide will be given to you by the pharmacist with each prescription and refill. Be sure to read this information carefully each time. Talk to your pediatrician regarding the use of this medicine in children. This medicine is not approved for use in children. Overdosage: If you think you have taken too much of this medicine contact a poison control center or emergency room at once. NOTE: This medicine is only for you. Do not share this medicine with others. What if I miss a dose? If you miss a dose, take it as soon as you can. If it is almost time for your next dose, take only that dose. Do not take double or extra doses. What may interact with this medicine? -alcohol or any product that contains alcohol -insulin -other stop smoking aids -theophylline -warfarin This list may not describe all possible interactions. Give your health care provider a list of all the medicines, herbs, non-prescription drugs, or dietary supplements you use. Also tell them if you smoke, drink alcohol, or use illegal drugs. Some items may interact with your medicine. What should I watch for while using this medicine? Visit your doctor or health  care professional for regular check ups. Ask for ongoing advice and encouragement from your doctor or healthcare professional, friends, and family to help you quit. If you smoke while on this medication, quit again Your mouth may get dry. Chewing sugarless gum or sucking hard candy, and drinking plenty of water may help. Contact your doctor if the problem does not go away or  is severe. You may get drowsy or dizzy. Do not drive, use machinery, or do anything that needs mental alertness until you know how this medicine affects you. Do not stand or sit up quickly, especially if you are an older patient. This reduces the risk of dizzy or fainting spells. Sleepwalking can happen during treatment with this medicine, and can sometimes lead to behavior that is harmful to you, other people, or property. Stop taking this medicine and tell your doctor if you start sleepwalking or have other unusual sleep-related activity. Decrease the amount of alcoholic beverages that you drink during treatment with this medicine until you know if this medicine affects your ability to tolerate alcohol. Some people have experienced increased drunkenness (intoxication), unusual or sometimes aggressive behavior, or no memory of things that have happened (amnesia) during treatment with this medicine. The use of this medicine may increase the chance of suicidal thoughts or actions. Pay special attention to how you are responding while on this medicine. Any worsening of mood, or thoughts of suicide or dying should be reported to your health care professional right away. What side effects may I notice from receiving this medicine? Side effects that you should report to your doctor or health care professional as soon as possible: -allergic reactions like skin rash, itching or hives, swelling of the face, lips, tongue, or throat -acting aggressive, being angry or violent, or acting on dangerous impulses -breathing problems -changes in vision -chest pain or chest tightness -confusion, trouble speaking or understanding -new or worsening depression, anxiety, or panic attacks -extreme increase in activity and talking (mania) -fast, irregular heartbeat -feeling faint or lightheaded, falls -fever -pain in legs when walking -problems with balance, talking, walking -redness, blistering, peeling or loosening of  the skin, including inside the mouth -ringing in ears -seeing or hearing things that aren't there (hallucinations) -seizures -sleepwalking -sudden numbness or weakness of the face, arm or leg -thoughts about suicide or dying, or attempts to commit suicide -trouble passing urine or change in the amount of urine -unusual bleeding or bruising -unusually weak or tired Side effects that usually do not require medical attention (report to your doctor or health care professional if they continue or are bothersome): -constipation -headache -nausea, vomiting -strange dreams -stomach gas -trouble sleeping This list may not describe all possible side effects. Call your doctor for medical advice about side effects. You may report side effects to FDA at 1-800-FDA-1088. Where should I keep my medicine? Keep out of the reach of children. Store at room temperature between 15 and 30 degrees C (59 and 86 degrees F). Throw away any unused medicine after the expiration date. NOTE: This sheet is a summary. It may not cover all possible information. If you have questions about this medicine, talk to your doctor, pharmacist, or health care provider.  2018 Elsevier/Gold Standard (2014-10-27 16:14:23)

## 2016-10-30 ENCOUNTER — Ambulatory Visit: Payer: PRIVATE HEALTH INSURANCE | Admitting: Family Medicine

## 2016-10-30 DIAGNOSIS — Z0189 Encounter for other specified special examinations: Secondary | ICD-10-CM

## 2016-11-04 ENCOUNTER — Ambulatory Visit (INDEPENDENT_AMBULATORY_CARE_PROVIDER_SITE_OTHER): Payer: PRIVATE HEALTH INSURANCE | Admitting: Family Medicine

## 2016-11-04 ENCOUNTER — Encounter: Payer: Self-pay | Admitting: Family Medicine

## 2016-11-04 VITALS — BP 150/81 | HR 79 | Wt 183.0 lb

## 2016-11-04 DIAGNOSIS — N529 Male erectile dysfunction, unspecified: Secondary | ICD-10-CM

## 2016-11-04 DIAGNOSIS — N469 Male infertility, unspecified: Secondary | ICD-10-CM | POA: Diagnosis not present

## 2016-11-04 MED ORDER — SILDENAFIL CITRATE 20 MG PO TABS: 40.0000 mg | ORAL_TABLET | ORAL | 11 refills | Status: DC | PRN

## 2016-11-04 NOTE — Patient Instructions (Signed)
Thank you for coming in today. Go get a new semen cup at [facility] and call and ask them to update the order for semen analysis.   Get fasting labs in the near future.  Labs should be done before 10 am .   Take sidanifil 3-5 pills prior to sex.  [facility] from [facility].   Sildenafil tablets (Viagra) What is this medicine? SILDENAFIL (sil DEN a fil) is used to treat erection problems in men. This medicine may be used for other purposes; ask your health care provider or pharmacist if you have questions. COMMON BRAND NAME(S): Viagra What should I tell my health care provider before I take this medicine? They need to know if you have any of these conditions: -bleeding disorders -eye or vision problems, including a rare inherited eye disease called retinitis pigmentosa -anatomical deformation of the penis, Peyronie's disease, or history of priapism (painful and prolonged erection) -heart disease, angina, a history of heart attack, irregular heart beats, or other heart problems -high or low blood pressure -history of blood diseases, like sickle cell anemia or leukemia -history of stomach bleeding -kidney disease -liver disease -stroke -an unusual or allergic reaction to sildenafil, other medicines, foods, dyes, or preservatives -pregnant or trying to get pregnant -breast-feeding How should I use this medicine? Take this medicine by mouth with a glass of water. Follow the directions on the prescription label. The dose is usually taken 1 hour before sexual activity. You should not take the dose more than once per day. Do not take your medicine more often than directed. Talk to your pediatrician regarding the use of this medicine in children. This medicine is not used in children for this condition. Overdosage: If you think you have taken too much of this medicine contact a poison control center or emergency room at once. NOTE: This medicine is only for you. Do not share  this medicine with others. What if I miss a dose? This does not apply. Do not take double or extra doses. What may interact with this medicine? Do not take this medicine with any of the following medications: -cisapride -nitrates like amyl nitrite, isosorbide dinitrate, isosorbide mononitrate, nitroglycerin -riociguat This medicine may also interact with the following medications: -antiviral medicines for HIV or AIDS -bosentan -certain medicines for benign prostatic hyperplasia (BPH) -certain medicines for blood pressure -certain medicines for fungal infections like ketoconazole and itraconazole -cimetidine -erythromycin -rifampin This list may not describe all possible interactions. Give your health care provider a list of all the medicines, herbs, non-prescription drugs, or dietary supplements you use. Also tell them if you smoke, drink alcohol, or use illegal drugs. Some items may interact with your medicine. What should I watch for while using this medicine? If you notice any changes in your vision while taking this drug, call your doctor or health care professional as soon as possible. Stop using this medicine and call your health care provider right away if you have a loss of sight in one or both eyes. Contact your doctor or health care professional right away if you have an erection that lasts longer than 4 hours or if it becomes painful. This may be a sign of a serious problem and must be treated right away to prevent permanent damage. If you experience symptoms of nausea, dizziness, chest pain or arm pain upon initiation of sexual activity after taking this medicine, you should refrain from further activity and call your doctor or health care professional as soon  as possible. Do not drink alcohol to excess (examples, 5 glasses of wine or 5 shots of whiskey) when taking this medicine. When taken in excess, alcohol can increase your chances of getting a headache or getting dizzy,  increasing your heart rate or lowering your blood pressure. Using this medicine does not protect you or your partner against HIV infection (the virus that causes AIDS) or other sexually transmitted diseases. What side effects may I notice from receiving this medicine? Side effects that you should report to your doctor or health care professional as soon as possible: -allergic reactions like skin rash, itching or hives, swelling of the face, lips, or tongue -breathing problems -changes in hearing -changes in vision -chest pain -fast, irregular heartbeat -prolonged or painful erection -seizures Side effects that usually do not require medical attention (report to your doctor or health care professional if they continue or are bothersome): -back pain -dizziness -flushing -headache -indigestion -muscle aches -nausea -stuffy or runny nose This list may not describe all possible side effects. Call your doctor for medical advice about side effects. You may report side effects to FDA at 1-800-FDA-1088. Where should I keep my medicine? Keep out of reach of children. Store at room temperature between 15 and 30 degrees C (59 and 86 degrees F). Throw away any unused medicine after the expiration date. NOTE: This sheet is a summary. It may not cover all possible information. If you have questions about this medicine, talk to your doctor, pharmacist, or health care provider.  2018 Elsevier/Gold Standard (2015-01-25 12:00:25)

## 2016-11-04 NOTE — Progress Notes (Signed)
Dan Brady is a 31 y.o. male who presents to [name] today for infertility and erectile dysfunction. Patient notes mild erectile dysfunction associated with difficulty conceiving. This is been going on for over a year. He's been having issues with maintaining erection sufficient to have sex. Additionally he has had trouble getting his significant other pregnant. They've had unprotected sex for 9 years with no pregnancies. She has been able to get pregnant with other partners. He was seen initially by urology but never followed up for semen analysis her lab testing. He has an appointment scheduled with his urologist on September 18.   Past Medical History:  Diagnosis Date  . Allergy   . Environmental allergies    No past surgical history on file. Social History  Substance Use Topics  . Smoking status: Current Every Day Smoker    Packs/day: 0.75    Years: 10.00    Types: Cigarettes  . Smokeless tobacco: Never Used  . Alcohol use Yes     Comment: occasional   family history includes Diabetes in his father and mother; Hypertension in his father and mother.  ROS as above:  Medications: Current Outpatient Prescriptions  Medication Sig Dispense Refill  . sildenafil (REVATIO) 20 MG tablet Take 2-5 tablets (40-100 mg total) by mouth as needed. 50 tablet 11  . varenicline (CHANTIX STARTING MONTH PAK) 0.5 MG X 11 & 1 MG X 42 tablet Take one 0.5mg  tablet by mouth once daily for 3 days, then increase to one 0.5mg  tablet twice daily for 3 days, then increase to one  tablet twice daily. (Patient not taking: Reported on 11/04/2016) 53 tablet 0  . varenicline (CHANTIX) 1 MG tablet Take 1 tablet (1 mg total) by mouth 2 (two) times daily. (Patient not taking: Reported on 11/04/2016) 60 tablet 3   No current facility-administered medications for this visit.    No Known  Allergies  Health Maintenance Health Maintenance  Topic Date Due  . INFLUENZA VACCINE  11/04/2017 (Originally 09/25/2016)  . TETANUS/TDAP  09/28/2023  . HIV Screening  Completed     Exam:  BP (!) 150/81   Pulse 79   Wt 183 lb (83 kg)   BMI 27.02 kg/m  Gen: Well NAD HEENT: EOMI,  MMM Lungs: Normal work of breathing. CTABL Heart: RRR no MRG Abd: NABS, Soft. Nondistended, Nontender Exts: Brisk capillary refill, warm and well perfused.     No results found for this or any previous visit (from the past 72 hour(s)). No results found.    Assessment and Plan: 31 y.o. male with  Erectile dysfunction: Unclear etiology. Plan for testosterone assessment. Empiric treatment with sildenafil and recheck if not improving.  Male infertility: Patient already has an order in place for semen analysis. I recommend that he get that done ASAP and follow up with urology. If he has low testosterone he may benefit from FSH/LH treatment.    Orders Placed This Encounter  Procedures  . Testosterone Total,Free,Bio, Males   Meds ordered this encounter  Medications  . sildenafil (REVATIO) 20 MG tablet    Sig: Take 2-5 tablets (40-100 mg total) by mouth as needed.    Dispense:  50 tablet    Refill:  11     Discussed warning signs or symptoms. Please see discharge instructions. Patient expresses understanding.  I spent 25 minutes with this patient, greater than 50% was face-to-face time counseling regarding differential diagnosis treatment plan  and options.

## 2016-11-06 ENCOUNTER — Ambulatory Visit: Payer: PRIVATE HEALTH INSURANCE

## 2016-11-06 ENCOUNTER — Encounter: Payer: Self-pay | Admitting: Family Medicine

## 2016-11-06 LAB — TESTOSTERONE TOTAL,FREE,BIO, MALES
Albumin: 4.6 g/dL (ref 3.6–5.1)
SEX HORMONE BINDING: 26 nmol/L (ref 10–50)
TESTOSTERONE BIOAVAILABLE: 101.2 ng/dL — AB (ref >=110.0)
TESTOSTERONE FREE: 48.2 pg/mL (ref 46.0–224.0)
TESTOSTERONE: 316 ng/dL (ref 250–827)

## 2016-11-12 ENCOUNTER — Ambulatory Visit: Payer: PRIVATE HEALTH INSURANCE | Admitting: Family Medicine

## 2016-11-12 DIAGNOSIS — Z0189 Encounter for other specified special examinations: Secondary | ICD-10-CM

## 2016-11-14 ENCOUNTER — Other Ambulatory Visit: Payer: Self-pay

## 2016-11-14 MED ORDER — SILDENAFIL CITRATE 20 MG PO TABS: 40.0000 mg | ORAL_TABLET | ORAL | 11 refills | Status: DC | PRN

## 2016-12-02 ENCOUNTER — Ambulatory Visit: Payer: PRIVATE HEALTH INSURANCE | Admitting: Family Medicine

## 2016-12-03 ENCOUNTER — Encounter: Payer: Self-pay | Admitting: Family Medicine

## 2016-12-03 ENCOUNTER — Ambulatory Visit (INDEPENDENT_AMBULATORY_CARE_PROVIDER_SITE_OTHER): Payer: PRIVATE HEALTH INSURANCE | Admitting: Family Medicine

## 2016-12-03 VITALS — BP 128/76 | HR 84 | Wt 184.0 lb

## 2016-12-03 DIAGNOSIS — F321 Major depressive disorder, single episode, moderate: Secondary | ICD-10-CM | POA: Diagnosis not present

## 2016-12-03 DIAGNOSIS — F909 Attention-deficit hyperactivity disorder, unspecified type: Secondary | ICD-10-CM | POA: Diagnosis not present

## 2016-12-03 HISTORY — DX: Attention-deficit hyperactivity disorder, unspecified type: F90.9

## 2016-12-03 MED ORDER — SERTRALINE HCL 50 MG PO TABS: ORAL_TABLET | ORAL | 3 refills | Status: DC

## 2016-12-03 MED ORDER — AMPHETAMINE-DEXTROAMPHET ER 25 MG PO CP24: 25.0000 mg | ORAL_CAPSULE | ORAL | 0 refills | Status: DC

## 2016-12-03 NOTE — Progress Notes (Signed)
Dan Brady is a 31 y.o. male who presents to [name] today for discuss depression and ADHD.  Patient notes that his Ms. loss of work due to fatigue. When asked in detail he does note some depression symptoms including anhedonia feeling down and lack of energy. He notes that he's having some relationship difficulty with his long-term partner. She has become pregnant and he suspects by another man. He is not sure what he should do it doesn't have anybody to talk about it with. He has never been diagnosed with depression in the past and has never taken any medicines for it.  Additionally patient notes a history of ADHD. This was previously managed by Dr. Evelene Croon with Adderall ER 25. He was essentially lost to follow-up and hasn't been not taking the Adderall for some time now. He estimates the last time he took it was in 2015. He notes that he feels scattered and has trouble completing tasks.   Past Medical History:  Diagnosis Date  . Allergy   . Environmental allergies    No past surgical history on file. Social History  Substance Use Topics  . Smoking status: Current Every Day Smoker    Packs/day: 0.75    Years: 10.00    Types: Cigarettes  . Smokeless tobacco: Never Used  . Alcohol use Yes     Comment: occasional   family history includes Diabetes in his father and mother; Hypertension in his father and mother.  ROS as above:  Medications: Current Outpatient Prescriptions  Medication Sig Dispense Refill  . sildenafil (REVATIO) 20 MG tablet Take 2-5 tablets (40-100 mg total) by mouth as needed. 50 tablet 11  . varenicline (CHANTIX STARTING MONTH PAK) 0.5 MG X 11 & 1 MG X 42 tablet Take one 0.5mg  tablet by mouth once daily for 3 days, then increase to one 0.5mg  tablet twice daily for 3 days, then increase to one  tablet twice daily. 53 tablet 0  .  varenicline (CHANTIX) 1 MG tablet Take 1 tablet (1 mg total) by mouth 2 (two) times daily. 60 tablet 3  . amphetamine-dextroamphetamine (ADDERALL XR) 25 MG 24 hr capsule Take 1 capsule by mouth every morning. 30 capsule 0  . sertraline (ZOLOFT) 50 MG tablet Take 1/2 pill po daily for 1 week then increase to 1 pill po daily 30 tablet 3   No current facility-administered medications for this visit.    No Known Allergies  Health Maintenance Health Maintenance  Topic Date Due  . INFLUENZA VACCINE  11/04/2017 (Originally 09/25/2016)  . TETANUS/TDAP  09/28/2023  . HIV Screening  Completed     Exam:  BP 128/76   Pulse 84   Wt 184 lb (83.5 kg)   BMI 27.17 kg/m  Gen: Well NAD HEENT: EOMI,  MMM Lungs: Normal work of breathing. CTABL Heart: RRR no MRG Abd: NABS, Soft. Nondistended, Nontender Exts: Brisk capillary refill, warm and well perfused.  Psych alert and oriented normal speech. Thought processes tearful at times. No SI or HI expressed. No active plan.  Depression screen [facility] 2/9 12/03/2016 10/02/2016 06/09/2015 03/30/2015 03/14/2015  Decreased Interest 0 0 0 0 0  Down, Depressed, Hopeless 3 0 0 0 0  PHQ - 2 Score 3 0 0 0 0  Altered sleeping 3 - - - -  Tired, decreased energy 3 - - - -  Change in appetite 0 - - - -  Feeling bad or  failure about yourself  0 - - - -  Trouble concentrating 3 - - - -  Moving slowly or fidgety/restless 3 - - - -  Suicidal thoughts 1 - - - -  PHQ-9 Score 16 - - - -  Difficult doing work/chores Somewhat difficult - - - -      No results found for this or any previous visit (from the past 72 hour(s)). No results found.    Assessment and Plan: 31 y.o. male with major depression. Maverik has major depressive disorder. I think this is probably why he's been missing work. ADHD may be a factor here as well. Plan to start a Zoloft taper with the plan to reach 100 mg soon. We'll recheck in 3 weeks.  ADHD: Based on records patient was last prescribed Adderall  August 2015. I think it's reasonable to re-prescribe this medication. We'll check back in 3 weeks to check blood pressure and effectiveness and side effects of this medicine. This diagnosis could also be causing some missed days.   No orders of the defined types were placed in this encounter.  Meds ordered this encounter  Medications  . amphetamine-dextroamphetamine (ADDERALL XR) 25 MG 24 hr capsule    Sig: Take 1 capsule by mouth every morning.    Dispense:  30 capsule    Refill:  0  . sertraline (ZOLOFT) 50 MG tablet    Sig: Take 1/2 pill po daily for 1 week then increase to 1 pill po daily    Dispense:  30 tablet    Refill:  3     Discussed warning signs or symptoms. Please see discharge instructions. Patient expresses understanding.  I spent 25 minutes with this patient, greater than 50% was face-to-face time counseling regarding ddx and treatment plan.

## 2016-12-03 NOTE — Patient Instructions (Addendum)
Thank you for coming in today. Start zoloft for depression. Start   (1/2 pill) daily for 1 week. Then we will increase it to 1 pill after 1 week.  We will recheck in 3 weeks.   Start Adderall for ADHD.  We are picking the same dose as in 2015.   Recheck with me in 3 weeks.   If you feel like you are going to hurt yourself or others please let me know.    Major Depressive Disorder, Adult Major depressive disorder (MDD) is a mental health condition. It may also be called clinical depression or unipolar depression. MDD usually causes feelings of sadness, hopelessness, or helplessness. MDD can also cause physical symptoms. It can interfere with work, school, relationships, and other everyday activities. MDD may be mild, moderate, or severe. It may occur once (single episode major depressive disorder) or it may occur multiple times (recurrent major depressive disorder). What are the causes? The exact cause of this condition is not known. MDD is most likely caused by a combination of things, which may include:  Genetic factors. These are traits that are passed along from parent to child.  Individual factors. Your personality, your behavior, and the way you handle your thoughts and feelings may contribute to MDD. This includes personality traits and behaviors learned from others.  Physical factors, such as: ? Differences in the part of your brain that controls emotion. This part of your brain may be different than it is in people who do not have MDD. ? Long-term (chronic) medical or psychiatric illnesses.  Social factors. Traumatic experiences or major life changes may play a role in the development of MDD.  What increases the risk? This condition is more likely to develop in women. The following factors may also make you more likely to develop MDD:  A family history of depression.  Troubled family relationships.  Abnormally low levels of certain brain chemicals.  Traumatic events in  childhood, especially abuse or the loss of a parent.  Being under a lot of stress, or long-term stress, especially from upsetting life experiences or losses.  A history of: ? Chronic physical illness. ? Other mental health disorders. ? Substance abuse.  Poor living conditions.  Experiencing social exclusion or discrimination on a regular basis.  What are the signs or symptoms? The main symptoms of MDD typically include:  Constant depressed or irritable mood.  Loss of interest in things and activities.  MDD symptoms may also include:  Sleeping or eating too much or too little.  Unexplained weight change.  Fatigue or low energy.  Feelings of worthlessness or guilt.  Difficulty thinking clearly or making decisions.  Thoughts of suicide or of harming others.  Physical agitation or weakness.  Isolation.  Severe cases of MDD may also occur with other symptoms, such as:  Delusions or hallucinations, in which you imagine things that are not real (psychotic depression).  Low-level depression that lasts at least a year (chronic depression or persistent depressive disorder).  Extreme sadness and hopelessness (melancholic depression).  Trouble speaking and moving (catatonic depression).  How is this diagnosed? This condition may be diagnosed based on:  Your symptoms.  Your medical history, including your mental health history. This may involve tests to evaluate your mental health. You may be asked questions about your lifestyle, including any drug and alcohol use, and how long you have had symptoms of MDD.  A physical exam.  Blood tests to rule out other conditions.  You must have a  depressed mood and at least four other MDD symptoms most of the day, nearly every day in the same 2-week timeframe before your health care provider can confirm a diagnosis of MDD. How is this treated? This condition is usually treated by mental health professionals, such as psychologists,  psychiatrists, and clinical social workers. You may need more than one type of treatment. Treatment may include:  Psychotherapy. This is also called talk therapy or counseling. Types of psychotherapy include: ? Cognitive behavioral therapy (CBT). This type of therapy teaches you to recognize unhealthy feelings, thoughts, and behaviors, and replace them with positive thoughts and actions. ? Interpersonal therapy (IPT). This helps you to improve the way you relate to and communicate with others. ? Family therapy. This treatment includes members of your family.  Medicine to treat anxiety and depression, or to help you control certain emotions and behaviors.  Lifestyle changes, such as: ? Limiting alcohol and drug use. ? Exercising regularly. ? Getting plenty of sleep. ? Making healthy eating choices. ? Spending more time outdoors.  Treatments involving stimulation of the brain can be used in situations with extremely severe symptoms, or when medicine or other therapies do not work over time. These treatments include electroconvulsive therapy, transcranial magnetic stimulation, and vagal nerve stimulation. Follow these instructions at home: Activity  Return to your normal activities as told by your health care provider.  Exercise regularly and spend time outdoors as told by your health care provider. General instructions  Take over-the-counter and prescription medicines only as told by your health care provider.  Do not drink alcohol. If you drink alcohol, limit your alcohol intake to no more than 1 drink a day for nonpregnant women and 2 drinks a day for men. One drink equals 12 oz of beer, 5 oz of wine, or 1 oz of hard liquor. Alcohol can affect any antidepressant medicines you are taking. Talk to your health care provider about your alcohol use.  Eat a healthy diet and get plenty of sleep.  Find activities that you enjoy doing, and make time to do them.  Consider joining a support  group. Your health care provider may be able to recommend a support group.  Keep all follow-up visits as told by your health care provider. This is important. Where to find more information: [employer] on Mental Illness  www.nami.org  [employer] of Mental Health  http://www.maynard.net/  National Suicide Prevention Lifeline  1-800-273-TALK 337 513 1725). This is free, 24-hour help.  Contact a health care provider if:  Your symptoms get worse.  You develop new symptoms. Get help right away if:  You self-harm.  You have serious thoughts about hurting yourself or others.  You see, hear, taste, smell, or feel things that are not present (hallucinate). This information is not intended to replace advice given to you by your health care provider. Make sure you discuss any questions you have with your health care provider. Document Released: 06/08/2012 Document Revised: 10/19/2015 Document Reviewed: 08/23/2015 Elsevier Interactive Patient Education  2017 [employer].

## 2016-12-11 ENCOUNTER — Encounter: Payer: Self-pay | Admitting: Family Medicine

## 2016-12-11 LAB — SEMEN EXAM, POST VASECTOMY

## 2016-12-23 ENCOUNTER — Encounter: Payer: Self-pay | Admitting: Family Medicine

## 2016-12-23 ENCOUNTER — Ambulatory Visit: Payer: PRIVATE HEALTH INSURANCE | Admitting: Family Medicine

## 2016-12-23 DIAGNOSIS — Z0189 Encounter for other specified special examinations: Secondary | ICD-10-CM

## 2017-01-09 ENCOUNTER — Ambulatory Visit: Payer: PRIVATE HEALTH INSURANCE

## 2017-01-13 ENCOUNTER — Ambulatory Visit: Payer: PRIVATE HEALTH INSURANCE | Admitting: Family Medicine

## 2017-01-20 ENCOUNTER — Encounter: Payer: Self-pay | Admitting: Family Medicine

## 2017-01-20 ENCOUNTER — Ambulatory Visit (INDEPENDENT_AMBULATORY_CARE_PROVIDER_SITE_OTHER): Payer: POS | Admitting: Family Medicine

## 2017-01-20 VITALS — BP 134/90 | HR 75 | Temp 98.2°F | Ht 68.5 in | Wt 184.6 lb

## 2017-01-20 DIAGNOSIS — F900 Attention-deficit hyperactivity disorder, predominantly inattentive type: Secondary | ICD-10-CM

## 2017-01-20 DIAGNOSIS — F321 Major depressive disorder, single episode, moderate: Secondary | ICD-10-CM

## 2017-01-20 DIAGNOSIS — N529 Male erectile dysfunction, unspecified: Secondary | ICD-10-CM | POA: Diagnosis not present

## 2017-01-20 DIAGNOSIS — N469 Male infertility, unspecified: Secondary | ICD-10-CM

## 2017-01-20 NOTE — Progress Notes (Signed)
   Subjective:    Patient ID: Dan Brady, male    DOB: 1985-03-02, 31 y.o.   MRN: [id number]  HPI 31 yr old male here to establish after [facility] E[facility] . He has 2 main concerns today. First he has had trouble with erectile dysfunction and he has used Viagra with some success. He had a testosterone panel run on 11-05-16 showing a low normal total testosterone of 316, but the bioavailable level was slightly low at 101. Also he has been upset over another issue. He and his long term girlfriend had been trying to have children for some time but were not successful. There was a time this summer when thye split up briefly and she had sex with someone else. She then became pregnant with twins, and there is a question as to who the father could be. Dan Brady gave a semen sample for fertility testing on 11-04-16 however the wrong test was ordered and the test was run for a post-vasectomy check. This revealed 440 motile sperm and 670 non-motile sperm, but nothing else was tested. He knows that the only accurate test will be to run paternity tests after the babies are born, but he is very curious as to whether he is fertile. Otherwise he has a long hx of ADHD, and Dr. Logan BoresEvans had given him some Adderall this summer. However he is no longer taking this now. He was also started on Zoloft for some depression, but he says he feels better now and is no longer taking that either.    Review of Systems  Constitutional: Negative.   Respiratory: Negative.   Cardiovascular: Negative.   Neurological: Negative.   Psychiatric/Behavioral: Negative.        Objective:   Physical Exam  Constitutional: He is oriented to person, place, and time. He appears well-developed and well-nourished.  Neck: No thyromegaly present.  Cardiovascular: Normal rate, regular rhythm, normal heart sounds and intact distal pulses.  Pulmonary/Chest: Effort normal and breath sounds normal. No respiratory  distress. He has no wheezes. He has no rales.  Lymphadenopathy:    He has no cervical adenopathy.  Neurological: He is alert and oriented to person, place, and time.  Psychiatric: He has a normal mood and affect. His behavior is normal. Thought content normal.          Assessment & Plan:  Intro visit with this patient who has ED issues and a borderline low testosterone level. He also is requesting help with fertility testing. We will refer him to Urology to address both these issues. He has ADHD that is well controlled off medication. His depression seems to be in remission. Gershon CraneStephen Alaira Level, MD

## 2017-01-29 ENCOUNTER — Encounter: Payer: Self-pay | Admitting: Family Medicine

## 2017-01-29 ENCOUNTER — Ambulatory Visit: Payer: POS | Admitting: Family Medicine

## 2017-01-29 VITALS — BP 138/90 | HR 78 | Temp 98.1°F | Wt 190.4 lb

## 2017-01-29 DIAGNOSIS — K219 Gastro-esophageal reflux disease without esophagitis: Secondary | ICD-10-CM | POA: Diagnosis not present

## 2017-01-29 DIAGNOSIS — Z9109 Other allergy status, other than to drugs and biological substances: Secondary | ICD-10-CM | POA: Insufficient documentation

## 2017-01-29 MED ORDER — OMEPRAZOLE 40 MG PO CPDR: 40.0000 mg | DELAYED_RELEASE_CAPSULE | Freq: Every day | ORAL | 3 refills | Status: DC

## 2017-01-29 NOTE — Progress Notes (Signed)
   Subjective:    Patient ID: Dan Brady, male    DOB: [birth year], 31 y.o.   MRN: [id number]  HPI Here for 2 problems. First he has had heartburn issues for about a year, and the worst time for him is at night in bed. He often wakes up choking with a sour taste in his mouth and heartburn. He has tried Pepto-Bismol, baking soda, and TUMS but these have not helped. He tried Prilosec OTC and this helped but it is too expensive. No trouble swallowing. Second for the past 4 weeks he has had sinus congestion and a stuffy nose. No fever or ST or cough. He tried Flonase with no relief.    Review of Systems  Constitutional: Negative.   HENT: Positive for congestion, postnasal drip and sinus pressure. Negative for sinus pain and sore throat.   Eyes: Negative.   Respiratory: Negative.   Cardiovascular: Negative.   Gastrointestinal: Negative.        Objective:   Physical Exam  Constitutional: He appears well-developed and well-nourished.  HENT:  Right Ear: External ear normal.  Left Ear: External ear normal.  Nose: Nose normal.  Mouth/Throat: Oropharynx is clear and moist.  Eyes: Conjunctivae are normal.  Neck: No thyromegaly present.  Cardiovascular: Normal rate, regular rhythm, normal heart sounds and intact distal pulses.  Pulmonary/Chest: Effort normal and breath sounds normal. No respiratory distress. He has no wheezes. He has no rales.  Abdominal: Soft. Bowel sounds are normal. He exhibits no distension and no mass. There is no tenderness. There is no rebound and no guarding.  Lymphadenopathy:    He has no cervical adenopathy.          Assessment & Plan:  He has GERD and he will start on Omeprazole 40 mg daily. I suggested he dose this in the evenings. He also has some allergies and he will try Claritin D daily. He is written out of work from 01-26-17 until 01-30-17.  Gershon CraneStephen Fry, MD

## 2017-02-27 ENCOUNTER — Encounter: Payer: Self-pay | Admitting: Family Medicine

## 2017-02-27 ENCOUNTER — Ambulatory Visit (INDEPENDENT_AMBULATORY_CARE_PROVIDER_SITE_OTHER): Payer: POS | Admitting: Family Medicine

## 2017-02-27 VITALS — BP 130/82 | HR 87 | Temp 97.4°F | Wt 186.2 lb

## 2017-02-27 DIAGNOSIS — H9202 Otalgia, left ear: Secondary | ICD-10-CM

## 2017-02-27 DIAGNOSIS — M25562 Pain in left knee: Secondary | ICD-10-CM

## 2017-02-27 DIAGNOSIS — M25512 Pain in left shoulder: Secondary | ICD-10-CM

## 2017-02-27 DIAGNOSIS — M25561 Pain in right knee: Secondary | ICD-10-CM | POA: Diagnosis not present

## 2017-02-27 DIAGNOSIS — G8929 Other chronic pain: Secondary | ICD-10-CM | POA: Diagnosis not present

## 2017-02-27 NOTE — Progress Notes (Signed)
   Subjective:    Patient ID: Dan Brady, male    DOB: [birth year], 32 y.o.   MRN: [id number]  HPI Here to check his left ear and for work notes. He developed pain in the left ear last week and he treated it with warm olive oil drops. No other symptoms. He now feels fine. He also notes ongoing pain in the left shoulder and in both knees. He takes Advil at times with fair relief.    Review of Systems  Constitutional: Negative.   HENT: Positive for ear pain. Negative for congestion, ear discharge, hearing loss, postnasal drip, sinus pressure, sinus pain and sore throat.   Eyes: Negative.   Respiratory: Negative.   Cardiovascular: Negative.   Musculoskeletal: Positive for arthralgias.       Objective:   Physical Exam  Constitutional: He appears well-developed and well-nourished.  HENT:  Right Ear: External ear normal.  Left Ear: External ear normal.  Nose: Nose normal.  Mouth/Throat: Oropharynx is clear and moist.  Eyes: Conjunctivae are normal.  Neck: Neck supple. No thyromegaly present.  Cardiovascular: Normal rate, regular rhythm, normal heart sounds and intact distal pulses.  Pulmonary/Chest: Effort normal and breath sounds normal. No respiratory distress. He has no wheezes. He has no rales.  Musculoskeletal: Normal range of motion. He exhibits no edema or tenderness.  Lymphadenopathy:    He has no cervical adenopathy.          Assessment & Plan:  He had an apparent left ear infection that has resolved. He is written out of work for 02-20-17, 02-24-17, and for 02-25-17. He will use Ibuprofen for the joint pains and recheck prn.  Gershon CraneStephen Efrata Brunner, MD

## 2017-03-13 ENCOUNTER — Encounter: Payer: Self-pay | Admitting: Family Medicine

## 2017-03-13 ENCOUNTER — Ambulatory Visit (INDEPENDENT_AMBULATORY_CARE_PROVIDER_SITE_OTHER): Payer: POS | Admitting: Family Medicine

## 2017-03-13 VITALS — BP 110/80 | HR 90 | Temp 98.0°F | Wt 194.4 lb

## 2017-03-13 DIAGNOSIS — F900 Attention-deficit hyperactivity disorder, predominantly inattentive type: Secondary | ICD-10-CM

## 2017-03-13 NOTE — Progress Notes (Signed)
   Subjective:    Patient ID: Dan Brady, male    DOB: 10/20/1985, 32 y.o.   MRN: [id number]  HPI He brings some FMLA forms to fill out that are normally filled out by Dr. Milagros Evenerupinder Kaur. These are concerning his ADHD, which she is treating. He says he could not see her until April but he thought we could fill them out instead.    Review of Systems  Constitutional: Negative.        Objective:   Physical Exam  Constitutional: He appears well-developed and well-nourished.          Assessment & Plan:  I told him that these forms need to be filled out by the provider caring for that particular issue, in this case Dr. Evelene CroonKaur. He understood.  Gershon CraneStephen Fry, MD

## 2017-03-25 ENCOUNTER — Ambulatory Visit: Payer: POS | Admitting: Family Medicine

## 2017-03-27 ENCOUNTER — Encounter: Payer: Self-pay | Admitting: Family Medicine

## 2017-03-27 ENCOUNTER — Ambulatory Visit (INDEPENDENT_AMBULATORY_CARE_PROVIDER_SITE_OTHER): Payer: POS | Admitting: Family Medicine

## 2017-03-27 VITALS — BP 110/70 | HR 94 | Temp 97.9°F | Wt 190.0 lb

## 2017-03-27 DIAGNOSIS — G8929 Other chronic pain: Secondary | ICD-10-CM | POA: Diagnosis not present

## 2017-03-27 DIAGNOSIS — M25512 Pain in left shoulder: Secondary | ICD-10-CM

## 2017-03-27 MED ORDER — DICLOFENAC SODIUM 75 MG PO TBEC: 75.0000 mg | DELAYED_RELEASE_TABLET | Freq: Two times a day (BID) | ORAL | 2 refills | Status: DC

## 2017-03-27 NOTE — Progress Notes (Signed)
   Subjective:    Patient ID: Dan Brady, male    DOB: 17-Jan-1986, 32 y.o.   MRN: [id number]  HPI Here for worsening left shoulder pain. This started about one year ago [facility] [employer] [facility]. He was given several steroid injections into the shoulder and these helped for a brief time, then the pain returned. Lately it has been much worse and he has missed some work because of this. He takes Ibuprofen with mixed results.    Review of Systems  Constitutional: Negative.   Respiratory: Negative.   Cardiovascular: Negative.   Musculoskeletal: Positive for arthralgias.       Objective:   Physical Exam  Constitutional: He appears well-developed and well-nourished.  Cardiovascular: Normal rate, regular rhythm, normal heart sounds and intact distal pulses.  Pulmonary/Chest: Effort normal and breath sounds normal. No respiratory distress. He has no wheezes.  Musculoskeletal:  Left shoulder is not tender but ROM is very limited by pain.           Assessment & Plan:  Left shoulder pain. Try Diclofenac bid prn. We will refer him to another Orthopedics office for a second opinion.  Gershon CraneStephen Jahnay Lantier, MD

## 2017-03-28 ENCOUNTER — Encounter: Payer: Self-pay | Admitting: Family Medicine

## 2017-04-03 ENCOUNTER — Telehealth: Payer: Self-pay

## 2017-04-03 NOTE — Telephone Encounter (Signed)
Called pt to advised that his [employer] paper work is completed did he want to pick this up at our office or have this mailed to him?   Requested the pt to call back.

## 2017-04-04 ENCOUNTER — Telehealth: Payer: Self-pay

## 2017-04-04 NOTE — Telephone Encounter (Signed)
I have called the pt twice today and left a VM to call back. Dr. Clent RidgesFry did complete his end of the [employer] paper work he did NOT fill out FMLA for long term he will need to have that done by the orthopedist that Dr. Clent RidgesFry referred him too.   Copied from CRM [id number]. Topic: General - Other >> Apr 04, 2017 10:52 AM Gerrianne ScalePayne, Angela L wrote: Reason for CRM: patient calling to see if his paper work is ready that he left for Dr Clent RidgesFry to fill out Dr Clent RidgesFry forgot to fill out one section on the form and someone told him that it would be ready today

## 2017-04-04 NOTE — Telephone Encounter (Signed)
Pt advised and came back to pick up his paper work.

## 2017-04-04 NOTE — Telephone Encounter (Signed)
Called pt and left a VM to call back.  

## 2017-04-17 ENCOUNTER — Ambulatory Visit (INDEPENDENT_AMBULATORY_CARE_PROVIDER_SITE_OTHER): Payer: 59 | Admitting: Orthopaedic Surgery

## 2017-04-30 ENCOUNTER — Ambulatory Visit (INDEPENDENT_AMBULATORY_CARE_PROVIDER_SITE_OTHER): Payer: 59 | Admitting: Orthopaedic Surgery

## 2017-06-10 ENCOUNTER — Encounter: Payer: Self-pay | Admitting: Family Medicine

## 2017-06-10 ENCOUNTER — Ambulatory Visit (INDEPENDENT_AMBULATORY_CARE_PROVIDER_SITE_OTHER): Payer: POS | Admitting: Family Medicine

## 2017-06-10 VITALS — BP 130/80 | HR 84 | Temp 98.3°F | Wt 193.4 lb

## 2017-06-10 DIAGNOSIS — Z0000 Encounter for general adult medical examination without abnormal findings: Secondary | ICD-10-CM

## 2017-06-10 NOTE — Progress Notes (Signed)
   Subjective:    Patient ID: Dan Nottinghamobert Lamar Brett, male    DOB: January 30, 1986, 32 y.o.   MRN: [id number]  Opened in error    Review of Systems     Objective:   Physical Exam        Assessment & Plan:

## 2017-06-11 ENCOUNTER — Ambulatory Visit: Payer: POS | Admitting: Family Medicine

## 2017-06-12 ENCOUNTER — Encounter: Payer: Self-pay | Admitting: Family Medicine

## 2017-06-12 ENCOUNTER — Ambulatory Visit (INDEPENDENT_AMBULATORY_CARE_PROVIDER_SITE_OTHER): Payer: POS | Admitting: Family Medicine

## 2017-06-12 VITALS — BP 120/80 | HR 83 | Temp 98.4°F | Ht 68.5 in | Wt 193.2 lb

## 2017-06-12 DIAGNOSIS — A084 Viral intestinal infection, unspecified: Secondary | ICD-10-CM | POA: Diagnosis not present

## 2017-06-12 NOTE — Progress Notes (Signed)
   Subjective:    Patient ID: Christie Nottinghamobert Lamar Sciuto, male    DOB: 07/03/1985, 32 y.o.   MRN: [id number]  HPI Here asking for a medical note for work. On 06-03-17 his wife gave birth to twins, and her delivery was complicated by pre-eclampsia. She had to spend several days in ICU before she could go home. He stayed at the hospital to help with the babies. After going home he had a few days of diarrhea and nausea without vomiting. No fever. He has slowlt been getting stronger this week and he has been able to get back to a normal diet. He wants to return to work tomorrow.    Review of Systems  Constitutional: Negative.   Respiratory: Negative.   Cardiovascular: Negative.   Gastrointestinal: Negative.   Neurological: Negative.        Objective:   Physical Exam  Constitutional: He appears well-developed and well-nourished.  Cardiovascular: Normal rate, regular rhythm, normal heart sounds and intact distal pulses.  Pulmonary/Chest: Effort normal and breath sounds normal. No respiratory distress. He has no wheezes. He has no rales.  Abdominal: Soft. Bowel sounds are normal. He exhibits no distension and no mass. There is no tenderness. There is no rebound and no guarding.          Assessment & Plan:  He seems ot have recovered from an enteritis. We wrote him out of work from 06-03-17 until 06-13-17. Gershon CraneStephen Fry, MD

## 2017-07-16 ENCOUNTER — Telehealth: Payer: Self-pay | Admitting: Family Medicine

## 2017-07-16 NOTE — Telephone Encounter (Signed)
Patient dropped off FMLA forms with dates that he has missed work.  Attached copy of FMLA forms from February 2019  Call patient to pick up  Disposition: Dr Gomez Cleverly folder

## 2017-07-16 NOTE — Telephone Encounter (Signed)
Sent to Dr.Fry to advise  

## 2017-07-17 NOTE — Telephone Encounter (Signed)
I referred him to Orthopedics and he saw Altamese Cabal PA at [facility] in [place] on 04-22-17. I have filled out the second set of FMLA papers to rxcuse him from the days he missed work up until 04-22-17. From that point on he has been under the care of Altamese Cabal PA, and all paperwork about missed work will need to be filled out by him.

## 2017-07-17 NOTE — Telephone Encounter (Signed)
Called pt and left a VM to call back. CRM created and sent to [facility] pool. 

## 2017-07-18 NOTE — Telephone Encounter (Signed)
Called pt again this afternoon. Left another VM to call back in regards to [facility] paper work.

## 2017-07-22 NOTE — Telephone Encounter (Signed)
Called pt and left a detailed VM advised pt to call back if they have any further questions or concerns

## 2017-10-12 IMAGING — CR DG FINGER THUMB 2+V*R*
1 series · 1 of 1 positions shown · non-contrast
Comparison: None in PACs

CLINICAL DATA: Right thumb pain following injury

EXAM:
RIGHT THUMB 2+V

[AP]
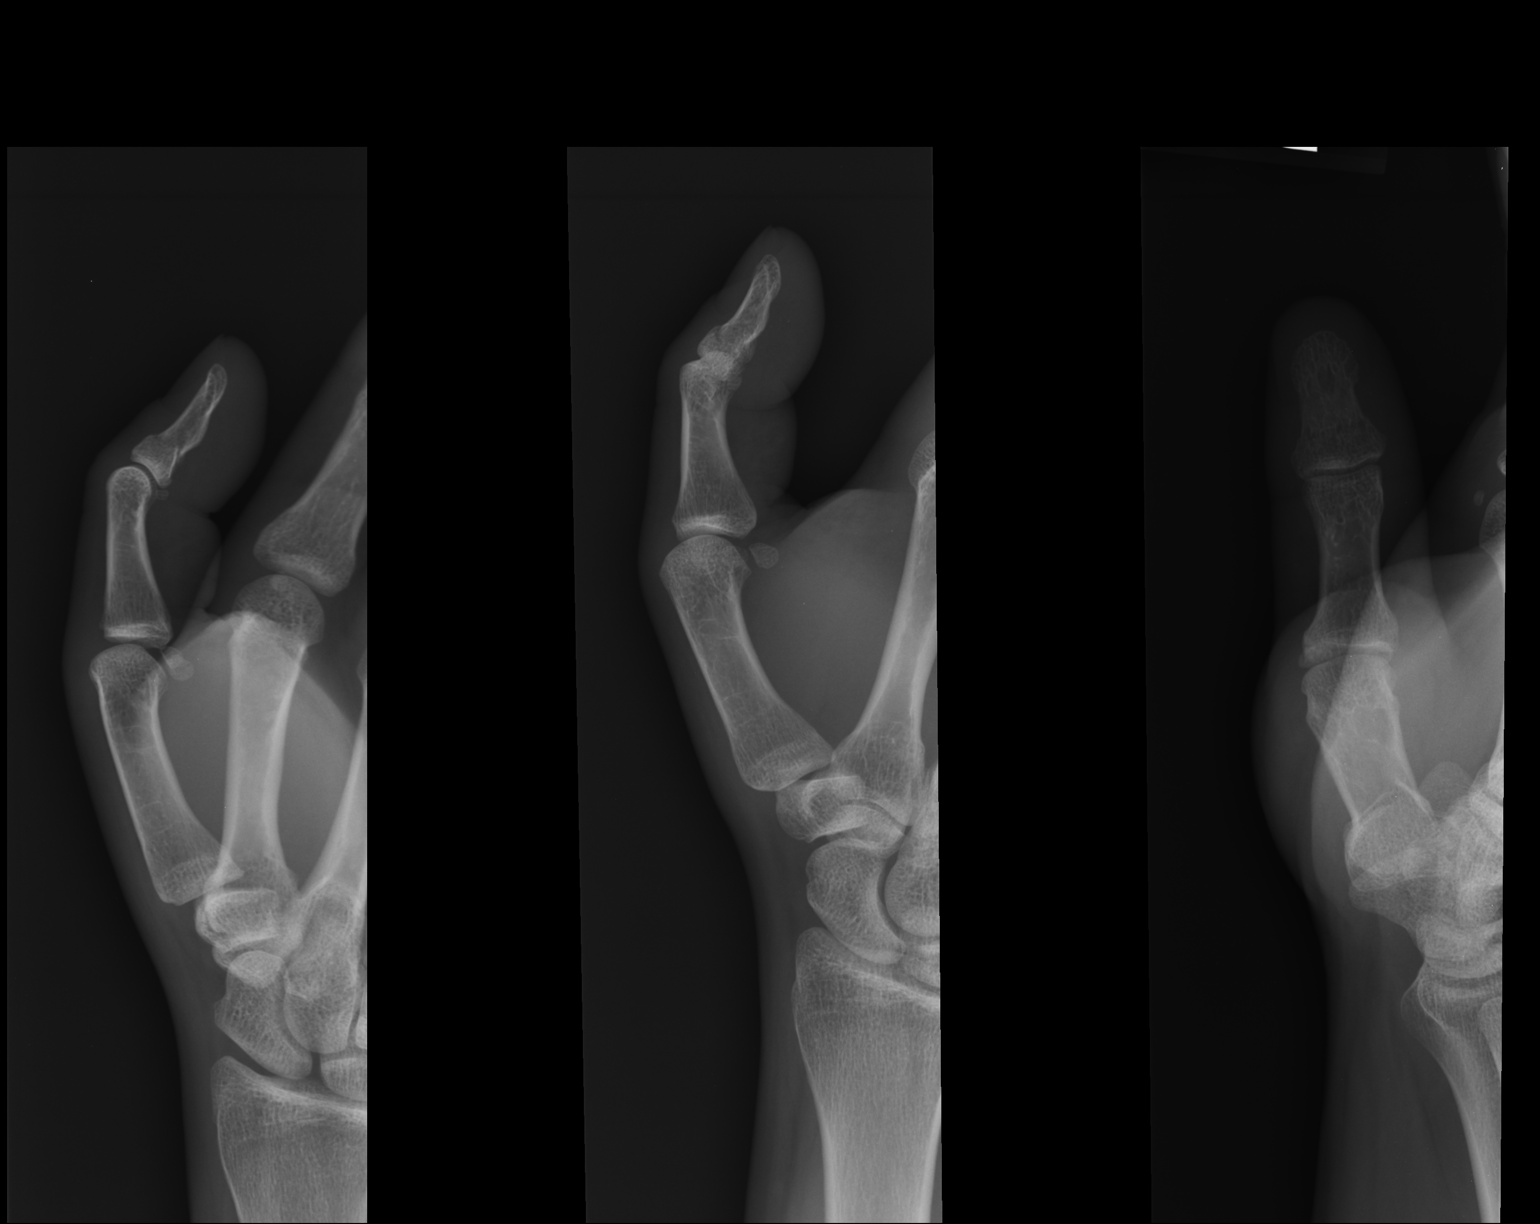

[1 of 1 positions shown; findings below may reference images not displayed]

FINDINGS: The patient has sustained a mildly displaced fracture through the
proximal third of the shaft of the distal phalanx of the thumb. The
fracture does not appear to involve the IP joint. The proximal
phalanx and the first metacarpal appear intact.
IMPRESSION: There is an acute mildly displaced fracture of the proximal third of
the shaft of the distal phalanx of the right thumb.

## 2018-02-02 ENCOUNTER — Other Ambulatory Visit: Payer: Self-pay | Admitting: Family Medicine

## 2018-02-17 ENCOUNTER — Telehealth: Payer: Self-pay | Admitting: Family Medicine

## 2018-02-17 MED ORDER — SILDENAFIL CITRATE 20 MG PO TABS: 40.0000 mg | ORAL_TABLET | ORAL | 4 refills | Status: DC | PRN

## 2018-03-18 ENCOUNTER — Telehealth: Payer: Self-pay | Admitting: Family Medicine

## 2018-03-18 ENCOUNTER — Other Ambulatory Visit: Payer: Self-pay | Admitting: Family Medicine

## 2018-03-18 MED ORDER — SILDENAFIL CITRATE 20 MG PO TABS: 40.0000 mg | ORAL_TABLET | ORAL | 3 refills | Status: DC | PRN

## 2018-03-18 NOTE — Telephone Encounter (Signed)
Attempted to call pt. To discuss request for transferring Sildenafil to different pharmacy. Left vm that this will be handled per his request.        Crist Infante routed conversation to [facility] Nurse Triage Pool 23 minutes ago (11:31 AM)    Deanna Artis J 23 minutes ago (11:30 AM)      Pt states he gets this sildenafil (REVATIO) 20 MG tablet At  [facility] -   Would like to know if you will transfer for him?  [facility] - [place], [place] - [address] [phone] (Phone) [phone] (Fax)          Documentation     Blakley, Cleotilde Neer   Deanna Artis J 25 minutes ago (11:29 AM

## 2018-03-18 NOTE — Telephone Encounter (Signed)
Pt states he gets this sildenafil (REVATIO) 20 MG tablet At  [facility] -   Would like to know if you will transfer for him?  [facility] - [place], [place] - [address] [phone] (Phone) [phone] (Fax)

## 2018-03-18 NOTE — Telephone Encounter (Signed)
See new telephone encounter of 03/18/18, re: refill on Sildenafil.

## 2018-05-08 ENCOUNTER — Telehealth: Payer: Self-pay

## 2018-05-08 MED ORDER — VARENICLINE TARTRATE 0.5 MG X 11 & 1 MG X 42 PO MISC: ORAL | 0 refills | Status: DC

## 2018-05-08 NOTE — Telephone Encounter (Signed)
Copied from CRM [id number]. Topic: General - Other >> May 08, 2018 11:49 AM Jaquita Rector A wrote: Reason for CRM: Patient called to request an Rx sent to the pharmacy on file for Chantix states that he is now ready to stop smoking. Please advise

## 2018-05-08 NOTE — Telephone Encounter (Signed)
Call in a Chantix starter pack with no rf. Also Chantix continuing pack with one rf

## 2018-05-08 NOTE — Telephone Encounter (Signed)
chantix has been sent to the pharmacy per the pts request.

## 2018-05-08 NOTE — Telephone Encounter (Signed)
Dr. Clent Ridges please advise on the rx for the chantix.  Thanks

## 2018-07-03 IMAGING — DX DG KNEE 1-2V*L*
4 series · 4 of 4 positions shown · non-contrast
Comparison: None.

CLINICAL DATA: Left knee pain for 6 months

EXAM:
LEFT KNEE - 1-2 VIEW; LEFT KNEE - COMPLETE 4+ VIEW

[knee lat]
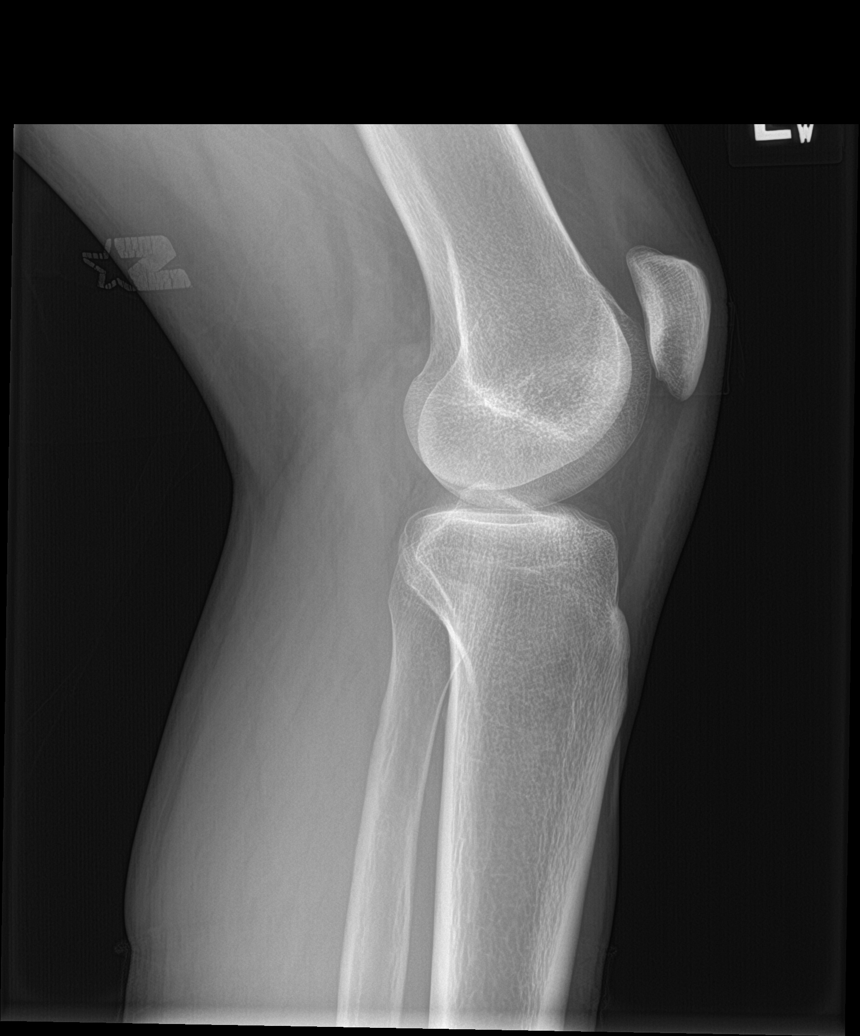

[knee sunrise]
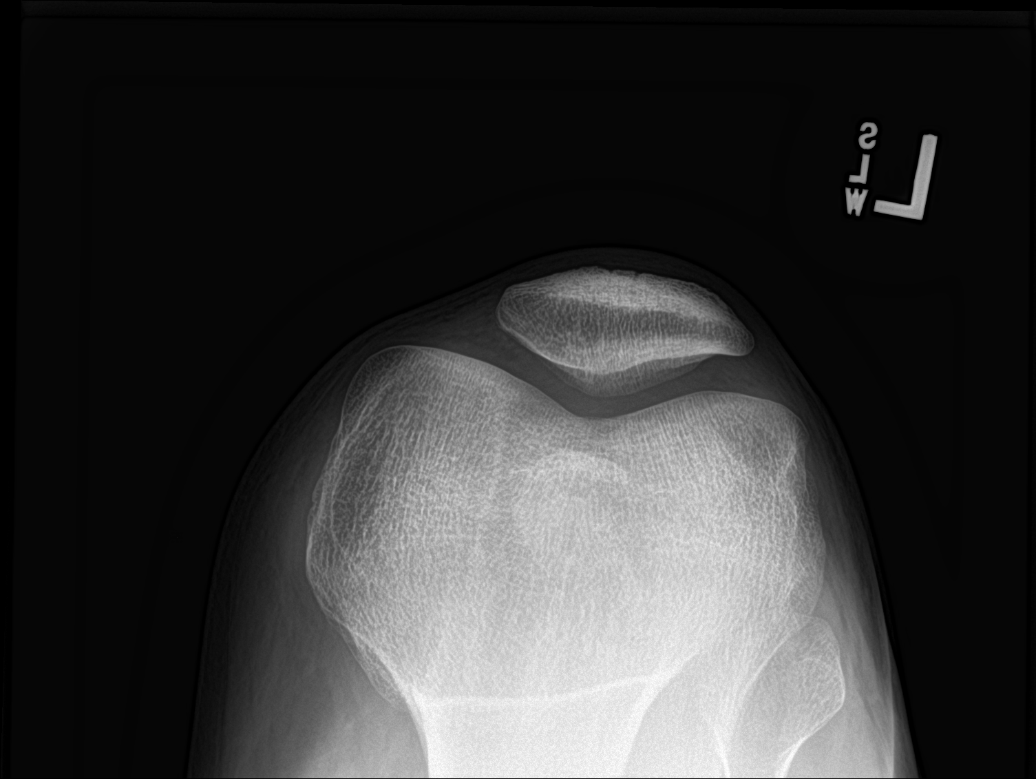

[knee ap bilat standing (1 of 2)]
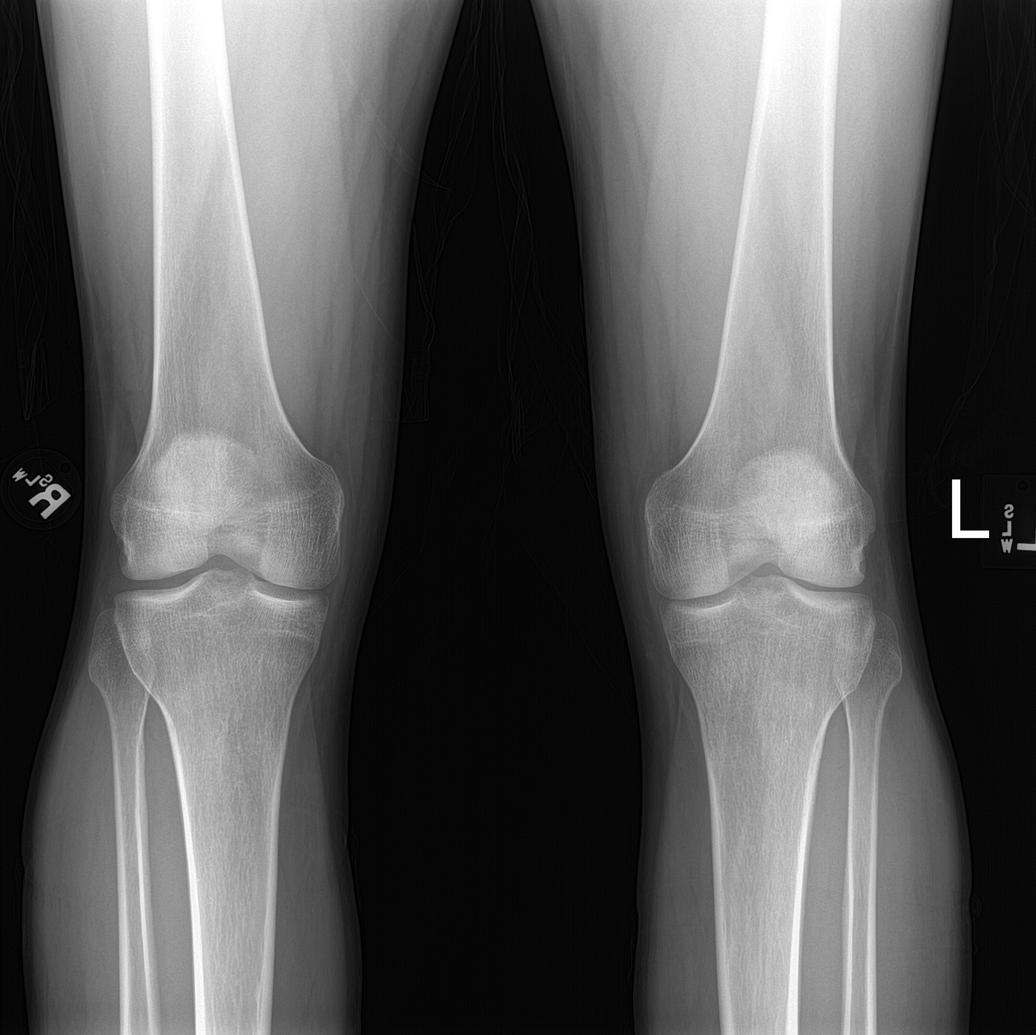

[knee ap bilat standing (2 of 2)]
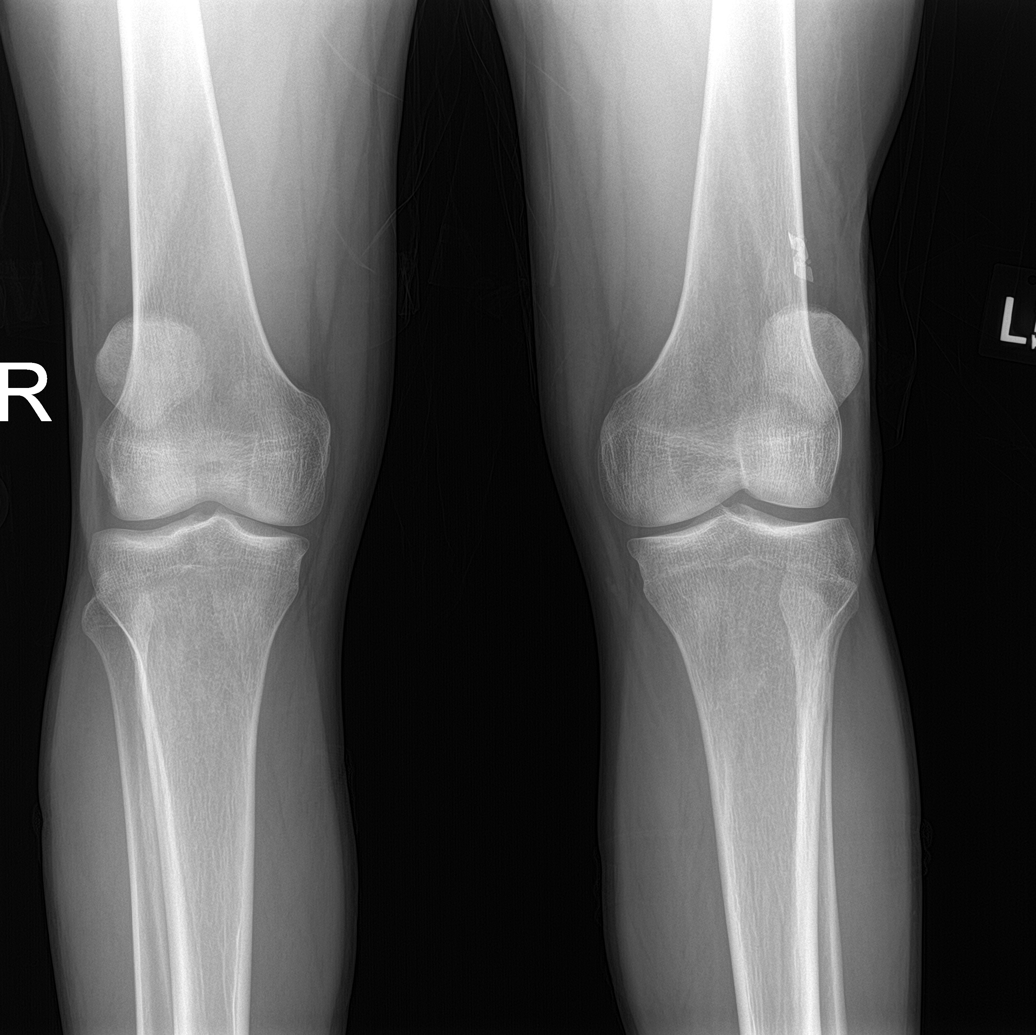

[4 of 4 positions shown; findings below may reference images not displayed]

FINDINGS: Slight joint space narrowing in the medial compartment of the left
knee. No acute bony abnormality. Specifically, no fracture,
subluxation, or dislocation. Soft tissues are intact. No joint
effusion.
IMPRESSION: Slight joint space narrowing in the medial compartment of the left
knee.

## 2018-10-08 ENCOUNTER — Other Ambulatory Visit: Payer: Self-pay

## 2018-10-08 ENCOUNTER — Telehealth: Payer: POS | Admitting: Family Medicine

## 2018-10-08 DIAGNOSIS — Z0289 Encounter for other administrative examinations: Secondary | ICD-10-CM

## 2018-11-10 ENCOUNTER — Encounter: Payer: Self-pay | Admitting: Family Medicine

## 2018-11-10 ENCOUNTER — Other Ambulatory Visit: Payer: Self-pay

## 2018-11-10 ENCOUNTER — Telehealth (INDEPENDENT_AMBULATORY_CARE_PROVIDER_SITE_OTHER): Payer: POS | Admitting: Family Medicine

## 2018-11-10 DIAGNOSIS — M778 Other enthesopathies, not elsewhere classified: Secondary | ICD-10-CM

## 2018-11-10 NOTE — Progress Notes (Signed)
Virtual Visit via Telephone Note  I connected with the patient on 11/10/18 at 11:30 AM EDT by telephone and verified that I am speaking with the correct person using two identifiers. We attempted to connect virtually but we had technical difficulties with the audio and video.     I discussed the limitations, risks, security and privacy concerns of performing an evaluation and management service by telephone and the availability of in person appointments. I also discussed with the patient that there may be a patient responsible charge related to this service. The patient expressed understanding and agreed to proceed.  Location patient: home Location provider: work or home office Participants present for the call: patient, provider Patient did not have a visit in the prior 7 days to address this/these issue(s).   History of Present Illness: Here for pain in the right wrist area that started 4 weeks ago but which got worse one week ago. The worst of the pain is in the wrist at the base of the thumb. It was swollen for awhile but not now. No recent trauma. His right hand is his dominant hand. 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 He rested the hand somewhat the past 2 days and he took some Ibuprofen, and today it feels much better.    Observations/Objective: Patient sounds cheerful and well on the phone. I do not appreciate any SOB. Speech and thought processing are grossly intact. Patient reported vitals:  Assessment and Plan: This sounds like a tendonitis. I advised him to rest the hand as much as possible, use ice packs several times a day, and to take 800 mg of Ibuprofen QID as needed. He is written out of work from 11-08-18 through today. Recheck prn.  Alysia Penna, MD   Follow Up Instructions:     [phone] 5-10 [phone] 11-20 [id number] 21-30 I did not refer this patient for an OV in the  next 24 hours for this/these issue(s).  I discussed the assessment and treatment plan with the patient. The patient was provided an opportunity to ask questions and all were answered. The patient agreed with the plan and demonstrated an understanding of the instructions.   The patient was advised to call back or seek an in-person evaluation if the symptoms worsen or if the condition fails to improve as anticipated.  I provided 10 minutes of non-face-to-face time during this encounter.   Alysia Penna, MD

## 2019-04-08 ENCOUNTER — Ambulatory Visit: Payer: 59 | Admitting: Podiatry

## 2019-05-21 ENCOUNTER — Ambulatory Visit: Payer: 59 | Admitting: Podiatry

## 2019-08-19 ENCOUNTER — Telehealth: Payer: Self-pay | Admitting: Family Medicine

## 2019-08-19 NOTE — Telephone Encounter (Signed)
Pt is calling in stating that he is out of Rx sildenafil (REVATIO) 20 MG.     Pharm:  [facility]

## 2019-08-20 MED ORDER — SILDENAFIL CITRATE 20 MG PO TABS: 40.0000 mg | ORAL_TABLET | ORAL | 0 refills | Status: DC | PRN

## 2019-08-20 NOTE — Telephone Encounter (Signed)
Rx sent in. Left a detailed message on verified voice mail informing the patient that his medication has been sent in.  

## 2020-07-03 ENCOUNTER — Telehealth: Payer: Self-pay | Admitting: Family Medicine

## 2020-07-03 NOTE — Telephone Encounter (Signed)
Left pt a voice message advising to call the office to schedule an office appointment for medication  review/refill. Pt last OV was in 10/2018

## 2020-07-03 NOTE — Telephone Encounter (Signed)
Patient is calling and requesting a refill for sildenafil (REVATIO) 20 MG tablet to be sent  To  [facility] - [place], [place]  [address] [place] [address]  Phone:  [phone] Fax:  [phone] CB is [phone]

## 2020-07-04 NOTE — Telephone Encounter (Signed)
Pt is scheduled for an office visit with Dr Clent Ridges on 07/06/2020

## 2020-07-06 ENCOUNTER — Ambulatory Visit (INDEPENDENT_AMBULATORY_CARE_PROVIDER_SITE_OTHER): Payer: POS | Admitting: Family Medicine

## 2020-07-06 ENCOUNTER — Other Ambulatory Visit: Payer: Self-pay

## 2020-07-06 ENCOUNTER — Encounter: Payer: Self-pay | Admitting: Family Medicine

## 2020-07-06 VITALS — BP 138/82 | HR 66 | Temp 98.1°F | Wt 192.6 lb

## 2020-07-06 DIAGNOSIS — R7989 Other specified abnormal findings of blood chemistry: Secondary | ICD-10-CM | POA: Diagnosis not present

## 2020-07-06 DIAGNOSIS — Z0000 Encounter for general adult medical examination without abnormal findings: Secondary | ICD-10-CM

## 2020-07-06 LAB — CBC WITH DIFFERENTIAL/PLATELET
Basophils Absolute: 0.1 10*3/uL (ref 0.0–0.1)
Basophils Relative: 2 % (ref 0.0–3.0)
Eosinophils Absolute: 0.5 10*3/uL (ref 0.0–0.7)
Eosinophils Relative: 11.3 % — ABNORMAL HIGH (ref 0.0–5.0)
HCT: 45 % (ref 39.0–52.0)
Hemoglobin: 15.3 g/dL (ref 13.0–17.0)
Lymphocytes Relative: 46.7 % — ABNORMAL HIGH (ref 12.0–46.0)
Lymphs Abs: 1.9 10*3/uL (ref 0.7–4.0)
MCHC: 34 g/dL (ref 30.0–36.0)
MCV: 91.7 fl (ref 78.0–100.0)
Monocytes Absolute: 0.5 10*3/uL (ref 0.1–1.0)
Monocytes Relative: 12.4 % — ABNORMAL HIGH (ref 3.0–12.0)
Neutro Abs: 1.1 10*3/uL — ABNORMAL LOW (ref 1.4–7.7)
Neutrophils Relative %: 27.6 % — ABNORMAL LOW (ref 43.0–77.0)
Platelets: 285 10*3/uL (ref 150.0–400.0)
RBC: 4.91 Mil/uL (ref 4.22–5.81)
RDW: 13.5 % (ref 11.5–15.5)
WBC: 4.2 10*3/uL (ref 4.0–10.5)

## 2020-07-06 LAB — HEPATIC FUNCTION PANEL
ALT: 25 U/L (ref 0–53)
AST: 29 U/L (ref 0–37)
Albumin: 4.7 g/dL (ref 3.5–5.2)
Alkaline Phosphatase: 70 U/L (ref 39–117)
Bilirubin, Direct: 0.1 mg/dL (ref 0.0–0.3)
Total Bilirubin: 0.6 mg/dL (ref 0.2–1.2)
Total Protein: 7 g/dL (ref 6.0–8.3)

## 2020-07-06 LAB — BASIC METABOLIC PANEL
BUN: 14 mg/dL (ref 6–23)
CO2: 29 mEq/L (ref 19–32)
Calcium: 9.8 mg/dL (ref 8.4–10.5)
Chloride: 102 mEq/L (ref 96–112)
Creatinine, Ser: 0.95 mg/dL (ref 0.40–1.50)
GFR: 102.47 mL/min (ref >60.00)
Glucose, Bld: 98 mg/dL (ref 70–99)
Potassium: 4.1 mEq/L (ref 3.5–5.1)
Sodium: 139 mEq/L (ref 135–145)

## 2020-07-06 LAB — LIPID PANEL
Cholesterol: 243 mg/dL — ABNORMAL HIGH (ref 0–200)
HDL: 46.3 mg/dL (ref >39.00)
NonHDL: 196.79
Total CHOL/HDL Ratio: 5
Triglycerides: 269 mg/dL — ABNORMAL HIGH (ref 0.0–149.0)
VLDL: 53.8 mg/dL — ABNORMAL HIGH (ref 0.0–40.0)

## 2020-07-06 LAB — TSH: TSH: 1.13 u[IU]/mL (ref 0.35–4.50)

## 2020-07-06 LAB — LDL CHOLESTEROL, DIRECT: Direct LDL: 130 mg/dL

## 2020-07-06 LAB — HEMOGLOBIN A1C: Hgb A1c MFr Bld: 5.6 % (ref 4.6–6.5)

## 2020-07-06 LAB — T3, FREE: T3, Free: 3.6 pg/mL (ref 2.3–4.2)

## 2020-07-06 LAB — T4, FREE: Free T4: 0.94 ng/dL (ref 0.60–1.60)

## 2020-07-06 MED ORDER — SILDENAFIL CITRATE 20 MG PO TABS: 40.0000 mg | ORAL_TABLET | ORAL | 11 refills | Status: DC | PRN

## 2020-07-06 NOTE — Progress Notes (Signed)
   Subjective:    Patient ID: Dan Brady, male    DOB: [birth year], 35 y.o.   MRN: [id number]  HPI Here for a well exam. He feels great. He has quit smoking and has lost some weight.    Review of Systems  Constitutional: Negative.   HENT: Negative.   Eyes: Negative.   Respiratory: Negative.   Cardiovascular: Negative.   Gastrointestinal: Negative.   Genitourinary: Negative.   Musculoskeletal: Negative.   Skin: Negative.   Neurological: Negative.   Psychiatric/Behavioral: Negative.        Objective:   Physical Exam Constitutional:      General: He is not in acute distress.    Appearance: Normal appearance. He is well-developed. He is not diaphoretic.  HENT:     Head: Normocephalic and atraumatic.     Right Ear: External ear normal.     Left Ear: External ear normal.     Nose: Nose normal.     Mouth/Throat:     Pharynx: No oropharyngeal exudate.  Eyes:     General: No scleral icterus.       Right eye: No discharge.        Left eye: No discharge.     Conjunctiva/sclera: Conjunctivae normal.     Pupils: Pupils are equal, round, and reactive to light.  Neck:     Thyroid: No thyromegaly.     Vascular: No JVD.     Trachea: No tracheal deviation.  Cardiovascular:     Rate and Rhythm: Normal rate and regular rhythm.     Heart sounds: Normal heart sounds. No murmur heard. No friction rub. No gallop.   Pulmonary:     Effort: Pulmonary effort is normal. No respiratory distress.     Breath sounds: Normal breath sounds. No wheezing or rales.  Chest:     Chest wall: No tenderness.  Abdominal:     General: Bowel sounds are normal. There is no distension.     Palpations: Abdomen is soft. There is no mass.     Tenderness: There is no abdominal tenderness. There is no guarding or rebound.  Genitourinary:    Penis: Normal. No tenderness.      Testes: Normal.  Musculoskeletal:        General: No tenderness. Normal range of motion.     Cervical back: Neck supple.   Lymphadenopathy:     Cervical: No cervical adenopathy.  Skin:    General: Skin is warm and dry.     Coloration: Skin is not pale.     Findings: No erythema or rash.  Neurological:     Mental Status: He is alert and oriented to person, place, and time.     Cranial Nerves: No cranial nerve deficit.     Motor: No abnormal muscle tone.     Coordination: Coordination normal.     Deep Tendon Reflexes: Reflexes are normal and symmetric. Reflexes normal.  Psychiatric:        Behavior: Behavior normal.        Thought Content: Thought content normal.        Judgment: Judgment normal.           Assessment & Plan:  Well exam. We discussed diet and exercise. Get fasting labs.  Gershon Crane, MD

## 2021-05-23 ENCOUNTER — Encounter: Payer: Self-pay | Admitting: Family Medicine

## 2021-05-23 ENCOUNTER — Ambulatory Visit: Payer: POS | Admitting: Family Medicine

## 2021-05-23 VITALS — BP 124/86 | HR 56 | Temp 98.2°F | Ht 68.5 in | Wt 187.2 lb

## 2021-05-23 DIAGNOSIS — M25512 Pain in left shoulder: Secondary | ICD-10-CM

## 2021-05-23 DIAGNOSIS — M25531 Pain in right wrist: Secondary | ICD-10-CM

## 2021-05-23 MED ORDER — DICLOFENAC SODIUM 75 MG PO TBEC
75.0000 mg | DELAYED_RELEASE_TABLET | Freq: Two times a day (BID) | ORAL | 5 refills | Status: DC | PRN
Start: 2021-05-23 — End: 2022-07-12

## 2021-05-23 NOTE — Progress Notes (Signed)
? ?  Subjective:  ? ? Patient ID: Dan Brady, male    DOB: 07/06/1985, 36 y.o.   MRN: [id number] ? ?HPI ?Here for intermittent pains in the left shoulder and the right wrist. We have seen him for these issues a few times over the past 8 years. Normally he can take Ibuprofen and get relief, but in the past 2 weeks this is not as effective. He had a recent change in his job such that he is doing more heavy lifting than before.  ? ? ?Review of Systems  ?Constitutional: Negative.   ?Respiratory: Negative.    ?Cardiovascular: Negative.   ?Musculoskeletal:  Positive for arthralgias.  ? ?   ?Objective:  ? Physical Exam ?Constitutional:   ?   General: He is not in acute distress. ?   Appearance: Normal appearance.  ?Cardiovascular:  ?   Rate and Rhythm: Normal rate and regular rhythm.  ?   Pulses: Normal pulses.  ?   Heart sounds: Normal heart sounds.  ?Pulmonary:  ?   Effort: Pulmonary effort is normal.  ?   Breath sounds: Normal breath sounds.  ?Musculoskeletal:  ?   Comments: Right wrist and left shoulder are normal on exam. No tenderness and ROM is full for both   ?Neurological:  ?   Mental Status: He is alert.  ? ? ? ? ? ?   ?Assessment & Plan:  ?Right wrist pain and left shoulder pain, likely from tendonitis. He will try Diclofenac BID as needed. ?Gershon Crane, MD ? ? ?

## 2021-06-22 ENCOUNTER — Ambulatory Visit: Payer: POS | Admitting: Family Medicine

## 2021-06-22 ENCOUNTER — Telehealth: Payer: Self-pay | Admitting: Family Medicine

## 2021-06-22 ENCOUNTER — Encounter: Payer: Self-pay | Admitting: Family Medicine

## 2021-06-22 VITALS — BP 140/98 | HR 63 | Temp 98.6°F | Wt 185.0 lb

## 2021-06-22 DIAGNOSIS — R110 Nausea: Secondary | ICD-10-CM

## 2021-06-22 MED ORDER — ONDANSETRON HCL 8 MG PO TABS: 8.0000 mg | ORAL_TABLET | Freq: Three times a day (TID) | ORAL | 0 refills | Status: DC | PRN

## 2021-06-22 NOTE — Telephone Encounter (Signed)
Pt called at 11:20am to resched his appt at 1:30pm same day. I advise that we can resched however he will be charged $50; pt understood and decided to keep his 1:30pm appt sched for today.  ? ?FYI. ?

## 2021-06-22 NOTE — Progress Notes (Signed)
? ?  Subjective:  ? ? Patient ID: Dan Brady, male    DOB: 09/21/1985, 36 y.o.   MRN: [id number] ? ?HPI ?Here asking for help with nausea. 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 Since then he has had some withdrawal symptoms like shakiness, inability to sleep, anxiety, and frequent nausea. He is determined to stay off THC. He asks for help with the nausea and for approval to take a few days off work.  ? ? ?Review of Systems  ?Constitutional: Negative.   ?Respiratory: Negative.    ?Cardiovascular: Negative.   ?Gastrointestinal:  Positive for nausea. Negative for abdominal distention, abdominal pain, constipation, diarrhea and vomiting.  ?Psychiatric/Behavioral:  Positive for agitation. Negative for dysphoric mood and hallucinations. The patient is nervous/anxious.   ? ?   ?Objective:  ? Physical Exam ?Constitutional:   ?   Appearance: Normal appearance. He is not ill-appearing.  ?Cardiovascular:  ?   Rate and Rhythm: Normal rate and regular rhythm.  ?   Pulses: Normal pulses.  ?   Heart sounds: Normal heart sounds.  ?Pulmonary:  ?   Effort: Pulmonary effort is normal.  ?   Breath sounds: Normal breath sounds.  ?Abdominal:  ?   General: Abdomen is flat. Bowel sounds are normal. There is no distension.  ?   Palpations: Abdomen is soft. There is no mass.  ?   Tenderness: There is no abdominal tenderness. There is no guarding or rebound.  ?   Hernia: No hernia is present.  ?Neurological:  ?   Mental Status: He is alert and oriented to person, place, and time. Mental status is at baseline.  ?Psychiatric:     ?   Mood and Affect: Mood normal.     ?   Behavior: Behavior normal.     ?   Thought Content: Thought content normal.  ? ? ? ? ? ?   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 He can try Zofran as needed for nausea. We wrote a note excusing him from work. Follow up as needed.  ?Gershon Crane, MD ? ? ?

## 2021-10-17 ENCOUNTER — Telehealth (INDEPENDENT_AMBULATORY_CARE_PROVIDER_SITE_OTHER): Payer: POS | Admitting: Family Medicine

## 2021-10-17 ENCOUNTER — Encounter: Payer: Self-pay | Admitting: Family Medicine

## 2021-10-17 VITALS — Temp 98.6°F

## 2021-10-17 DIAGNOSIS — U071 COVID-19: Secondary | ICD-10-CM | POA: Diagnosis not present

## 2021-10-17 NOTE — Progress Notes (Signed)
   Subjective:    Patient ID: Dan Brady, male    DOB: [birth year], 36 y.o.   MRN: [id number]  HPI Virtual Visit via Video Note  I connected with the patient on 10/17/21 at  2:15 PM EDT by a video enabled telemedicine application and verified that I am speaking with the correct person using two identifiers.  Location patient: home Location provider:work or home office Persons participating in the virtual visit: patient, provider  I discussed the limitations of evaluation and management by telemedicine and the availability of in person appointments. The patient expressed understanding and agreed to proceed.   HPI: Here for a Covid-19 infection. 4 days ago he developed fever to 100 degrees, body aches, ST, and a dry cough. No SOB. He tested positive for the Covid virus yesterday. He is drinking fluids and taking Tylenol. He feels much better today.   ROS: See pertinent positives and negatives per HPI.  Past Medical History:  Diagnosis Date   ADHD 12/03/2016   Allergy    Depression    Environmental allergies     History reviewed. No pertinent surgical history.  Family History  Problem Relation Age of Onset   Diabetes Mother    Hypertension Mother    Hypertension Father    Diabetes Father      Current Outpatient Medications:    diclofenac (VOLTAREN) 75 MG EC tablet, Take 1 tablet (75 mg total) by mouth 2 (two) times daily as needed for mild pain., Disp: 60 tablet, Rfl: 5   ondansetron (ZOFRAN) 8 MG tablet, Take 1 tablet (8 mg total) by mouth every 8 (eight) hours as needed for nausea or vomiting., Disp: 30 tablet, Rfl: 0   sildenafil (REVATIO) 20 MG tablet, Take 2-5 tablets (40-100 mg total) by mouth as needed., Disp: 50 tablet, Rfl: 11  EXAM:  VITALS per patient if applicable:  GENERAL: alert, oriented, appears well and in no acute distress  HEENT: atraumatic, conjunttiva clear, no obvious abnormalities on inspection of external nose and ears  NECK: normal  movements of the head and neck  LUNGS: on inspection no signs of respiratory distress, breathing rate appears normal, no obvious gross SOB, gasping or wheezing  CV: no obvious cyanosis  MS: moves all visible extremities without noticeable abnormality  PSYCH/NEURO: pleasant and cooperative, no obvious depression or anxiety, speech and thought processing grossly intact  ASSESSMENT AND PLAN: Covid-19 infection. He seems to be at the end of it. He will rest this week and return to work on 10-21-21.  Gershon Crane, MD  Discussed the following assessment and plan:  No diagnosis found.     I discussed the assessment and treatment plan with the patient. The patient was provided an opportunity to ask questions and all were answered. The patient agreed with the plan and demonstrated an understanding of the instructions.   The patient was advised to call back or seek an in-person evaluation if the symptoms worsen or if the condition fails to improve as anticipated.      Review of Systems     Objective:   Physical Exam        Assessment & Plan:

## 2021-11-05 NOTE — Progress Notes (Unsigned)
    Aleen Sells D.[facility] [address] [place] [address] Phone: [phone]   Assessment and Plan:     There are no diagnoses linked to this encounter.  ***   Pertinent previous records reviewed include ***   Follow Up: ***     Subjective:   I, Aija Scarfo, am serving as a Neurosurgeon for Doctor Richardean Sale  Chief Complaint: left shin pain   HPI:   11/06/2021 Patient is a 36 year old male complaining of left shin pain. Patient states  Relevant Historical Information: ***  Additional pertinent review of systems negative.   Current Outpatient Medications:    diclofenac (VOLTAREN) 75 MG EC tablet, Take 1 tablet (75 mg total) by mouth 2 (two) times daily as needed for mild pain., Disp: 60 tablet, Rfl: 5   ondansetron (ZOFRAN) 8 MG tablet, Take 1 tablet (8 mg total) by mouth every 8 (eight) hours as needed for nausea or vomiting., Disp: 30 tablet, Rfl: 0   sildenafil (REVATIO) 20 MG tablet, Take 2-5 tablets (40-100 mg total) by mouth as needed., Disp: 50 tablet, Rfl: 11   Objective:     There were no vitals filed for this visit.    There is no height or weight on file to calculate BMI.    Physical Exam:    ***   Electronically signed by:  Aleen Sells [facility] 8:42 AM 11/05/21

## 2021-11-06 ENCOUNTER — Ambulatory Visit (INDEPENDENT_AMBULATORY_CARE_PROVIDER_SITE_OTHER): Payer: POS | Admitting: Sports Medicine

## 2021-11-06 ENCOUNTER — Ambulatory Visit (INDEPENDENT_AMBULATORY_CARE_PROVIDER_SITE_OTHER): Payer: POS

## 2021-11-06 ENCOUNTER — Ambulatory Visit: Payer: POS | Admitting: Family Medicine

## 2021-11-06 VITALS — BP 124/82 | HR 84 | Ht 68.0 in | Wt 204.0 lb

## 2021-11-06 DIAGNOSIS — M79662 Pain in left lower leg: Secondary | ICD-10-CM

## 2021-11-06 DIAGNOSIS — S86812A Strain of other muscle(s) and tendon(s) at lower leg level, left leg, initial encounter: Secondary | ICD-10-CM

## 2021-11-06 MED ORDER — MELOXICAM 15 MG PO TABS: 15.0000 mg | ORAL_TABLET | Freq: Every day | ORAL | 0 refills | Status: DC

## 2021-11-06 NOTE — Patient Instructions (Addendum)
Good to see you - Start meloxicam 15 mg daily x2 weeks.  If still having pain after 2 weeks, complete 3rd-week of meloxicam. May use remaining meloxicam as needed once daily for pain control.  Do not to use additional NSAIDs while taking meloxicam.  May use Tylenol [phone] mg 2 to 3 times a day for breakthrough pain. [facility] Work note provided As needed follow up

## 2021-12-24 ENCOUNTER — Ambulatory Visit (INDEPENDENT_AMBULATORY_CARE_PROVIDER_SITE_OTHER): Payer: POS | Admitting: Sports Medicine

## 2021-12-24 ENCOUNTER — Ambulatory Visit (INDEPENDENT_AMBULATORY_CARE_PROVIDER_SITE_OTHER): Payer: POS

## 2021-12-24 VITALS — BP 120/89 | HR 69 | Ht 68.0 in | Wt 201.0 lb

## 2021-12-24 DIAGNOSIS — M2142 Flat foot [pes planus] (acquired), left foot: Secondary | ICD-10-CM | POA: Diagnosis not present

## 2021-12-24 DIAGNOSIS — M79671 Pain in right foot: Secondary | ICD-10-CM | POA: Diagnosis not present

## 2021-12-24 DIAGNOSIS — M2141 Flat foot [pes planus] (acquired), right foot: Secondary | ICD-10-CM

## 2021-12-24 MED ORDER — MELOXICAM 15 MG PO TABS: 15.0000 mg | ORAL_TABLET | Freq: Every day | ORAL | 0 refills | Status: DC

## 2021-12-24 NOTE — Patient Instructions (Signed)
Good to see you - Start meloxicam 15 mg daily x2 weeks.  If still having pain after 2 weeks, complete 3rd-week of meloxicam. May use remaining meloxicam as needed once daily for pain control.  Do not to use additional NSAIDs while taking meloxicam.  May use Tylenol [phone] mg 2 to 3 times a day for breakthrough pain. Recommend getting flat foot inserts for your shoes can go to [employer] or other similar stores Work note provided  4 week follow up

## 2021-12-24 NOTE — Progress Notes (Signed)
Benito Mccreedy D.[facility] [place] Phone: [phone]   Assessment and Plan:     1. Right foot pain 2. Pes planus of both feet -Chronic with exacerbation, initial sports medicine visit - Recurrence of right foot pain, present off and on for years, with new flare over the past 1+ week.  Consistent with excess strain of her metatarsals likely due to patient's pes planus bilaterally - Recommend getting inserts for pes planus for work shoes and wearing cushioned and supportive shoes - Start meloxicam 15 mg daily x2 weeks.  If still having pain after 2 weeks, complete 3rd-week of meloxicam. May use remaining meloxicam as needed once daily for pain control.  Do not to use additional NSAIDs while taking meloxicam.  May use Tylenol [phone] mg 2 to 3 times a day for breakthrough pain. - X-ray obtained in clinic.  My interpretation: No acute fracture or dislocation.  Pes planus - Pain has been significant enough over the past 1 week to keep patient out of work last Thursday, 12/20/2021, Saturday, 12/23/2021, and today 12/24/2021.  Patient given work note to be out for these days, but may return to work tomorrow without restrictions  Other orders - meloxicam (MOBIC) 15 MG tablet; Take 1 tablet (15 mg total) by mouth daily.    Pertinent previous records reviewed include none   Follow Up: 4 weeks for reevaluation.  Could perform ultrasound if no improvement or worsening of symptoms to rule out Morton's neuroma   Subjective:   I, Pincus Badder, am serving as a Education administrator for Doctor Glennon Mac   Chief Complaint: left shin pain    HPI:    11/06/2021 Patient is a 36 year old male complaining of left shin pain. Patient states that he was running last wednesday and Thursday it was achy and swollen feels like he got hit with a baseball bat, thought the pain would go away , today is the first day the pain has eased, he is a an avid  runner and only took a couple of days off, upped his mileage, no radiating pain ,no numbness or tingling, was taking ib last week but none this week , doesn't feel like the leg is going to give out on him   12/24/2021 Patient states that he is feeling better, now he has right foot pain , wants to get an xray     Relevant Historical Information: None pertinent  Additional pertinent review of systems negative.   Current Outpatient Medications:    diclofenac (VOLTAREN) 75 MG EC tablet, Take 1 tablet (75 mg total) by mouth 2 (two) times daily as needed for mild pain., Disp: 60 tablet, Rfl: 5   ondansetron (ZOFRAN) 8 MG tablet, Take 1 tablet (8 mg total) by mouth every 8 (eight) hours as needed for nausea or vomiting., Disp: 30 tablet, Rfl: 0   sildenafil (REVATIO) 20 MG tablet, Take 2-5 tablets (40-100 mg total) by mouth as needed., Disp: 50 tablet, Rfl: 11   meloxicam (MOBIC) 15 MG tablet, Take 1 tablet (15 mg total) by mouth daily., Disp: 30 tablet, Rfl: 0   Objective:     Vitals:   12/24/21 1314  BP: 120/89  Pulse: 69  SpO2: 96%  Weight: 201 lb (91.2 kg)  Height: 5\' 8"  (1.727 m)      Body mass index is 30.56 kg/m.    Physical Exam:    Gen: Appears well, nad, nontoxic and pleasant Psych:  Alert and oriented, appropriate mood and affect Neuro: sensation intact, strength is 5/5 with df/pf/inv/ev, muscle tone wnl Skin: no susupicious lesions or rashes  Right foot/ankle: no deformity, no swelling or effusion TTP dorsal surface of third and fourth metatarsals NTTP over fibular head, lat mal, medial mal, achilles, navicular, base of 5th, ATFL, CFL, deltoid, calcaneous or midfoot ROM DF 30, PF 45, inv/ev intact Negative ant drawer, talar tilt, rotation test, tib-fib squeeze test. Neg thompson No pain with resisted inversion or eversion  Mild pain with metatarsal squeeze test  Electronically signed by:  Aleen [facility]ells [facility] 1:40 PM 12/24/21

## 2022-07-12 ENCOUNTER — Encounter: Payer: Self-pay | Admitting: Family Medicine

## 2022-07-12 ENCOUNTER — Ambulatory Visit: Payer: 59 | Admitting: Family Medicine

## 2022-07-12 VITALS — BP 120/70 | HR 77 | Temp 98.2°F | Wt 199.2 lb

## 2022-07-12 DIAGNOSIS — M25512 Pain in left shoulder: Secondary | ICD-10-CM | POA: Diagnosis not present

## 2022-07-12 NOTE — Progress Notes (Signed)
   Subjective:    Patient ID: Dan Brady, male    DOB: [birth year], 37 y.o.   MRN: [id number]  HPI Here for 2 weeks of sharp pains in the anterior left shoulder. No neck pain, no pain down the arm. No weakness or numbness. 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 He has been resting it and taking Ibuprofen. He saw Dr. Clementeen Graham for a similar pain in 2018.    Review of Systems  Constitutional: Negative.   Respiratory: Negative.    Cardiovascular: Negative.   Musculoskeletal:  Positive for arthralgias.       Objective:   Physical Exam Constitutional:      General: He is not in acute distress.    Appearance: Normal appearance.  Cardiovascular:     Rate and Rhythm: Normal rate and regular rhythm.     Pulses: Normal pulses.     Heart sounds: Normal heart sounds.  Pulmonary:     Effort: Pulmonary effort is normal.     Breath sounds: Normal breath sounds.  Musculoskeletal:     Comments: The left shoulder is tender in the anterior sub AC area. No crepitus. ROM is full except abduction and internal rotation are limited by pain   Neurological:     Mental Status: He is alert.           Assessment & Plan:  Left shoulder pain consistent with rotator cuff tendonitis. He will take Ibuprofen as needed. We will refer him back to Dr. Clayburn Pert. Written out of work from 07-08-22 through 07-13-22.  Gershon Crane, MD

## 2022-07-23 ENCOUNTER — Encounter: Payer: Self-pay | Admitting: Family Medicine

## 2022-08-05 ENCOUNTER — Ambulatory Visit: Payer: POS | Admitting: Family Medicine

## 2022-08-05 NOTE — Progress Notes (Deleted)
    Dan Brady D.[facility] [address] [place] [address] Phone: [phone]   Assessment and Plan:     There are no diagnoses linked to this encounter.  ***   Pertinent previous records reviewed include ***   Follow Up: ***     Subjective:   I, Dan Brady, am serving as a Neurosurgeon for Doctor Richardean Sale  Chief Complaint: right hand and left shoulder   HPI:   08/06/2022 Patient is a 37 year old male complaining of right hand and left shoulder pain. Patient states  Relevant Historical Information: ***  Additional pertinent review of systems negative.  No current outpatient medications on file.   Objective:     There were no vitals filed for this visit.    There is no height or weight on file to calculate BMI.    Physical Exam:    ***   Electronically signed by:  Dan Brady [facility] 11:37 AM 08/05/22

## 2022-08-06 ENCOUNTER — Ambulatory Visit: Payer: POS | Admitting: Sports Medicine

## 2022-10-31 ENCOUNTER — Telehealth (INDEPENDENT_AMBULATORY_CARE_PROVIDER_SITE_OTHER): Payer: 59 | Admitting: Family Medicine

## 2022-10-31 ENCOUNTER — Encounter: Payer: Self-pay | Admitting: Family Medicine

## 2022-10-31 DIAGNOSIS — U071 COVID-19: Secondary | ICD-10-CM

## 2022-10-31 NOTE — Progress Notes (Signed)
Virtual Visit via Video Note  I connected with Dan Brady on 10/31/22 at  1:00 PM EDT by a video enabled telemedicine application and verified that I am speaking with the correct person using two identifiers.  Location patient: home Location provider:work or home office Persons participating in the virtual visit: patient, provider  I discussed the limitations of evaluation and management by telemedicine and the availability of in person appointments. The patient expressed understanding and agreed to proceed.  Chief Complaint  Patient presents with   Covid Positive    Started 5 days ago, sore throat, congestion, no fever, tested on Monday,     HPI: Patient is a 37 year old male followed by Dr. Clent Ridges seen for acute concern.  Pt developed a mild ST on Sat.  Had nasal congestion and rhinorrhea on Sunday.  Appetite slightly decreased. Denies HA, n/v, diarrhea, SOB.  Had positive COVID tests on Mon and Tues.  Feeling better today.  Patient was at work starting Sunday, 11/16/2022.  Requesting note.   ROS: See pertinent positives and negatives per HPI.  Past Medical History:  Diagnosis Date   ADHD 12/03/2016   Allergy    Depression    Environmental allergies     No past surgical history on file.  Family History  Problem Relation Age of Onset   Diabetes Mother    Hypertension Mother    Hypertension Father    Diabetes Father       Current Outpatient Medications:    ibuprofen (ADVIL) 800 MG tablet, Take 800 mg by mouth every 6 (six) hours as needed., Disp: , Rfl:    lidocaine (XYLOCAINE) 1 % (with preservative) injection, by Infiltration route., Disp: , Rfl:    amoxicillin (AMOXIL) 500 MG capsule, Take 500 mg by mouth every 8 (eight) hours. (Patient not taking: Reported on 10/31/2022), Disp: , Rfl:    celecoxib (CELEBREX) 200 MG capsule, Take by mouth. (Patient not taking: Reported on 10/31/2022), Disp: , Rfl:   EXAM:  VITALS per patient if applicable: RR between 12-20  bpm  GENERAL: alert, oriented, appears well and in no acute distress  HEENT: atraumatic, conjunctiva clear, no obvious abnormalities on inspection of external nose and ears  NECK: normal movements of the head and neck  LUNGS: on inspection no signs of respiratory distress, breathing rate appears normal, no obvious gross SOB, gasping or wheezing  CV: no obvious cyanosis  MS: moves all visible extremities without noticeable abnormality  PSYCH/NEURO: pleasant and cooperative, no obvious depression or anxiety, speech and thought processing grossly intact  ASSESSMENT AND PLAN:  Discussed the following assessment and plan:  COVID-19 virus infection Symptoms starting 10/27/2022 with positive test on 9/2 and 10/29/2022.  Discussed r/b/a of antiviral medication.  As symptoms improving and relatively mild we will continue using OTC medications as needed.  Given strict precautions.  Patient given note for work.  Advised may return tomorrow 10/31/2022.  Follow-up as needed    I discussed the assessment and treatment plan with the patient. The patient was provided an opportunity to ask questions and all were answered. The patient agreed with the plan and demonstrated an understanding of the instructions.   The patient was advised to call back or seek an in-person evaluation if the symptoms worsen or if the condition fails to improve as anticipated.   Deeann Saint, MD

## 2022-10-31 NOTE — Progress Notes (Signed)
"  Patient was unable to self-report due to a lack of equipment at home via telehealth"

## 2023-08-25 ENCOUNTER — Encounter: Admitting: Podiatry

## 2023-08-25 NOTE — Progress Notes (Signed)
Patient did not show for scheduled appointment today.
